# Patient Record
Sex: Male | Born: 1945 | Race: White | Hispanic: No | Marital: Married | Smoking: Former smoker
Health system: Southern US, Community
[De-identification: ages and names within clinical notes are randomized; demographics above are authoritative.]

## PROBLEM LIST (undated history)

## (undated) DIAGNOSIS — I10 Essential (primary) hypertension: Secondary | ICD-10-CM

## (undated) DIAGNOSIS — F0390 Unspecified dementia without behavioral disturbance: Secondary | ICD-10-CM

## (undated) DIAGNOSIS — G20 Parkinson's disease: Secondary | ICD-10-CM

## (undated) DIAGNOSIS — C801 Malignant (primary) neoplasm, unspecified: Secondary | ICD-10-CM

## (undated) DIAGNOSIS — E119 Type 2 diabetes mellitus without complications: Secondary | ICD-10-CM

## (undated) DIAGNOSIS — C61 Malignant neoplasm of prostate: Secondary | ICD-10-CM

## (undated) HISTORY — PX: CLOSED REDUCTION CLAVICLE FRACTURE: SUR253

---

## 2019-06-03 ENCOUNTER — Encounter (HOSPITAL_COMMUNITY): Payer: Self-pay | Admitting: *Deleted

## 2019-06-03 ENCOUNTER — Emergency Department (HOSPITAL_COMMUNITY): Payer: Medicare HMO

## 2019-06-03 ENCOUNTER — Emergency Department (HOSPITAL_COMMUNITY)
Admission: EM | Admit: 2019-06-03 | Discharge: 2019-06-03 | Disposition: A | Discharge status: Home / Self Care | Payer: Medicare HMO | Attending: Emergency Medicine | Admitting: Emergency Medicine

## 2019-06-03 ENCOUNTER — Other Ambulatory Visit: Payer: Self-pay

## 2019-06-03 DIAGNOSIS — R791 Abnormal coagulation profile: Secondary | ICD-10-CM | POA: Diagnosis not present

## 2019-06-03 DIAGNOSIS — R410 Disorientation, unspecified: Secondary | ICD-10-CM | POA: Diagnosis not present

## 2019-06-03 DIAGNOSIS — Z7982 Long term (current) use of aspirin: Secondary | ICD-10-CM | POA: Insufficient documentation

## 2019-06-03 DIAGNOSIS — Z87891 Personal history of nicotine dependence: Secondary | ICD-10-CM | POA: Insufficient documentation

## 2019-06-03 DIAGNOSIS — R479 Unspecified speech disturbances: Secondary | ICD-10-CM

## 2019-06-03 DIAGNOSIS — F0390 Unspecified dementia without behavioral disturbance: Secondary | ICD-10-CM | POA: Diagnosis not present

## 2019-06-03 DIAGNOSIS — E119 Type 2 diabetes mellitus without complications: Secondary | ICD-10-CM | POA: Diagnosis not present

## 2019-06-03 DIAGNOSIS — I10 Essential (primary) hypertension: Secondary | ICD-10-CM | POA: Diagnosis not present

## 2019-06-03 DIAGNOSIS — Z7984 Long term (current) use of oral hypoglycemic drugs: Secondary | ICD-10-CM | POA: Insufficient documentation

## 2019-06-03 DIAGNOSIS — R4182 Altered mental status, unspecified: Secondary | ICD-10-CM | POA: Diagnosis present

## 2019-06-03 DIAGNOSIS — Z79899 Other long term (current) drug therapy: Secondary | ICD-10-CM | POA: Insufficient documentation

## 2019-06-03 DIAGNOSIS — R4781 Slurred speech: Secondary | ICD-10-CM | POA: Insufficient documentation

## 2019-06-03 HISTORY — DX: Essential (primary) hypertension: I10

## 2019-06-03 HISTORY — DX: Malignant (primary) neoplasm, unspecified: C80.1

## 2019-06-03 HISTORY — DX: Type 2 diabetes mellitus without complications: E11.9

## 2019-06-03 HISTORY — DX: Malignant neoplasm of prostate: C61

## 2019-06-03 HISTORY — DX: Unspecified dementia, unspecified severity, without behavioral disturbance, psychotic disturbance, mood disturbance, and anxiety: F03.90

## 2019-06-03 LAB — CBC WITH DIFFERENTIAL/PLATELET
Abs Immature Granulocytes: 0.04 10*3/uL (ref 0.00–0.07)
Basophils Absolute: 0 10*3/uL (ref 0.0–0.1)
Basophils Relative: 0 %
Eosinophils Absolute: 0.1 10*3/uL (ref 0.0–0.5)
Eosinophils Relative: 1 %
HCT: 36.6 % — ABNORMAL LOW (ref 39.0–52.0)
Hemoglobin: 11.8 g/dL — ABNORMAL LOW (ref 13.0–17.0)
Immature Granulocytes: 1 %
Lymphocytes Relative: 10 %
Lymphs Abs: 0.8 10*3/uL (ref 0.7–4.0)
MCH: 30.5 pg (ref 26.0–34.0)
MCHC: 32.2 g/dL (ref 30.0–36.0)
MCV: 94.6 fL (ref 80.0–100.0)
Monocytes Absolute: 0.8 10*3/uL (ref 0.1–1.0)
Monocytes Relative: 10 %
Neutro Abs: 6.4 10*3/uL (ref 1.7–7.7)
Neutrophils Relative %: 78 %
Platelets: 191 10*3/uL (ref 150–400)
RBC: 3.87 MIL/uL — ABNORMAL LOW (ref 4.22–5.81)
RDW: 13.1 % (ref 11.5–15.5)
WBC: 8.1 10*3/uL (ref 4.0–10.5)
nRBC: 0 % (ref 0.0–0.2)

## 2019-06-03 LAB — COMPREHENSIVE METABOLIC PANEL
ALT: 5 U/L (ref 0–44)
AST: 15 U/L (ref 15–41)
Albumin: 3.5 g/dL (ref 3.5–5.0)
Alkaline Phosphatase: 75 U/L (ref 38–126)
Anion gap: 10 (ref 5–15)
BUN: 23 mg/dL (ref 8–23)
CO2: 26 mmol/L (ref 22–32)
Calcium: 8.8 mg/dL — ABNORMAL LOW (ref 8.9–10.3)
Chloride: 97 mmol/L — ABNORMAL LOW (ref 98–111)
Creatinine, Ser: 0.9 mg/dL (ref 0.61–1.24)
GFR calc Af Amer: >60 mL/min (ref >60)
GFR calc non Af Amer: >60 mL/min (ref >60)
Glucose, Bld: 91 mg/dL (ref 70–99)
Potassium: 4.6 mmol/L (ref 3.5–5.1)
Sodium: 133 mmol/L — ABNORMAL LOW (ref 135–145)
Total Bilirubin: 0.6 mg/dL (ref 0.3–1.2)
Total Protein: 6.3 g/dL — ABNORMAL LOW (ref 6.5–8.1)

## 2019-06-03 LAB — PROTIME-INR
INR: 1.1 (ref 0.8–1.2)
Prothrombin Time: 13.6 seconds (ref 11.4–15.2)

## 2019-06-03 LAB — APTT: aPTT: 29 seconds (ref 24–36)

## 2019-06-03 LAB — CBG MONITORING, ED: Glucose-Capillary: 86 mg/dL (ref 70–99)

## 2019-06-03 LAB — LIPASE, BLOOD: Lipase: 27 U/L (ref 11–51)

## 2019-06-03 MED ORDER — SODIUM CHLORIDE 0.9 % IV BOLUS
500.0000 mL | Freq: Once | INTRAVENOUS | Status: AC
  Administered 2019-06-03: 500 mL via INTRAVENOUS

## 2019-06-03 MED ORDER — SODIUM CHLORIDE 0.9 % IV SOLN
100.0000 mL/h | INTRAVENOUS | Status: DC
  Administered 2019-06-03: 21:00:00 100 mL/h via INTRAVENOUS

## 2019-06-03 MED ORDER — LACTATED RINGERS IV BOLUS
500.0000 mL | Freq: Once | INTRAVENOUS | Status: AC
  Administered 2019-06-03: 18:00:00 500 mL via INTRAVENOUS

## 2019-06-03 NOTE — ED Notes (Signed)
No pain

## 2019-06-03 NOTE — ED Provider Notes (Signed)
[facility]ERG[facility] DEPARTMENT Provider Note   CSN: [place]:[id number] Arrival date & time: 06/03/19  1634     History Chief Complaint  Patient presents with  . Altered Mental Status    Seth Mcpherson is a 74 y.o. male.  The history is provided by the patient and the spouse.  Altered Mental Status Presenting symptoms: confusion   Presenting symptoms comment:  Slurred speech, jumbled speech Severity:  Moderate Most recent episode:  Today Episode history:  Single Duration:  1 minute Timing:  Constant Progression:  Resolved Chronicity:  New Context: dementia   Context: not head injury, taking medications as prescribed and not recent illness   Associated symptoms: slurred speech   Associated symptoms: no abdominal pain, no difficulty breathing, no fever, no headaches, no nausea and no vomiting        Past Medical History:  Diagnosis Date  . Cancer ([place])   . Dementia ([place])   . Diabetes mellitus without complication ([place])   . Hypertension   . Prostate cancer ([place])     There are no problems to display for this patient.   History reviewed. No pertinent surgical history.     History reviewed. No pertinent family history.  Social History   Tobacco Use  . Smoking status: Former Research scientist (life sciences)  . Smokeless tobacco: Never Used  Substance Use Topics  . Alcohol use: Not Currently  . Drug use: Not on file    Home Medications Prior to Admission medications   Medication Sig Start Date End Date Taking? Authorizing Provider  aspirin EC 81 MG tablet Take 81 mg by mouth daily.   Yes [provider]  atorvastatin (LIPITOR) 40 MG tablet Take 40 mg by mouth daily.   Yes [provider]  carbidopa-levodopa (SINEMET IR) 25-100 MG tablet Take 0.5-1 tablets by mouth 4 (four) times daily.   Yes [provider]  diclofenac Sodium (VOLTAREN) 1 % GEL Apply topically 4 (four) times daily.   Yes [provider]  folic acid (FOLVITE) 1 MG  tablet Take 3 mg by mouth daily.   Yes [provider]  lisinopril (ZESTRIL) 20 MG tablet Take 20 mg by mouth daily.   Yes [provider]  metFORMIN (GLUCOPHAGE) 1000 MG tablet Take 1,000 mg by mouth 2 (two) times daily with a meal.   Yes [provider]  Methotrexate 25 MG/ML SOSY Inject 0.8 mLs into the skin once a week.   Yes [provider]  PARoxetine (PAXIL) 40 MG tablet Take 40 mg by mouth daily.   Yes [provider]  rivastigmine (EXELON) 1.5 MG capsule Take 1.5 mg by mouth 2 (two) times daily.   Yes [provider]  tamsulosin (FLOMAX) 0.4 MG CAPS capsule Take 0.4 mg by mouth.   Yes [provider]    Allergies    Patient has no known allergies.  Review of Systems   Review of Systems  Constitutional: Negative for fever.  Respiratory: Negative for cough and shortness of breath.   Cardiovascular: Negative for chest pain.  Gastrointestinal: Negative for abdominal pain, nausea and vomiting.  Neurological: Negative for headaches.  Psychiatric/Behavioral: Positive for confusion.  All other systems reviewed and are negative.   Physical Exam Updated Vital Signs BP 109/70   Pulse 65   Temp 98 F (36.7 C)   Resp 19   SpO2 100%   Physical Exam Vitals and nursing note reviewed.  Constitutional:      Appearance: He is well-developed.  HENT:     Head: Normocephalic and atraumatic.     Mouth/Throat:     Mouth: Mucous membranes are moist.     Pharynx: Oropharynx is clear.  Eyes:     Extraocular Movements: Extraocular movements intact.     Conjunctiva/sclera: Conjunctivae normal.     Pupils: Pupils are equal, round, and reactive to light.  Cardiovascular:     Rate and Rhythm: Normal rate and regular rhythm.     Pulses: Normal pulses.     Heart sounds: No murmur.  Pulmonary:     Effort: Pulmonary effort is normal. No respiratory distress.     Breath sounds: Normal breath sounds.  Abdominal:     Palpations:  Abdomen is soft.     Tenderness: There is no abdominal tenderness. There is no guarding or rebound.  Musculoskeletal:     Cervical back: Neck supple. No rigidity.  Skin:    General: Skin is warm and dry.  Neurological:     Mental Status: He is alert. Mental status is at baseline.     Cranial Nerves: Cranial nerves are intact. No dysarthria.     Sensory: Sensation is intact.     Motor: Motor function is intact. No pronator drift.     Coordination: Finger-Nose-Finger Test normal.     Deep Tendon Reflexes: Reflexes are normal and symmetric.  Psychiatric:        Mood and Affect: Mood normal.        Behavior: Behavior normal.     ED Results / Procedures / Treatments   Labs (all labs ordered are listed, but only abnormal results are displayed) Labs Reviewed  CBC WITH DIFFERENTIAL/PLATELET - Abnormal; Notable for the following components:      Result Value   RBC 3.87 (*)    Hemoglobin 11.8 (*)    HCT 36.6 (*)    All other components within normal limits  COMPREHENSIVE METABOLIC PANEL - Abnormal; Notable for the following components:   Sodium 133 (*)    Chloride 97 (*)    Calcium 8.8 (*)    Total Protein 6.3 (*)    All other components within normal limits  LIPASE, BLOOD  PROTIME-INR  APTT  URINALYSIS, ROUTINE W REFLEX MICROSCOPIC  RAPID URINE DRUG SCREEN, HOSP PERFORMED  CBG MONITORING, ED    EKG EKG Interpretation  Date/Time:  Friday June 03 2019 16:34:38 EST Ventricular Rate:  61 PR Interval:    QRS Duration: 101 QT Interval:  397 QTC Calculation: 400 R Axis:   27 Text Interpretation: Sinus rhythm Low voltage, extremity leads Baseline wander Abnormal ECG Confirmed by Carmin Muskrat [id number]) on 06/03/2019 6:49:35 PM   Radiology DG Chest 2 View  Result Date: 06/03/2019 CLINICAL DATA:  Altered mental status, weakness EXAM: CHEST - 2 VIEW COMPARISON:  None. FINDINGS: Mild atelectasis or scar at the left base. No consolidation or effusion. Normal heart size. No  pneumothorax. Possible calcified lung nodule at the right lateral lung base. IMPRESSION: No active cardiopulmonary disease. Electronically Signed   By: Donavan Foil M.D.   On: 06/03/2019 18:21   MR BRAIN WO CONTRAST  Result Date: 06/03/2019 CLINICAL DATA:  Transient ischemic attack (TIA). Additional history obtained from [place] had just finished receiving COVID vaccine with 15 minute episode of slurred speech, confusion, all cleared by the time EMS arrived EXAM: MRI HEAD WITHOUT CONTRAST TECHNIQUE: Multiplanar, multiecho pulse sequences of the brain and surrounding structures were obtained without intravenous contrast. COMPARISON:  No pertinent prior  studies available for comparison. FINDINGS: Brain: There is no evidence of acute infarct. No evidence of intracranial mass. No midline shift or extra-axial fluid collection. No chronic intracranial blood products. Moderate patchy T2/FLAIR hyperintensity within the cerebral white matter is nonspecific, but consistent with chronic small vessel ischemic disease. Chronic lacunar infarcts within the bilateral corona radiata/basal ganglia. To a lesser degree, chronic small vessel ischemic changes are present within the pons. Moderate generalized parenchymal atrophy Vascular: Flow voids maintained within the proximal large arterial vessels. Skull and upper cervical spine: No focal marrow lesion. Sinuses/Orbits: Visualized orbits demonstrate no acute abnormality. Mild ethmoid sinus mucosal thickening. Trace fluid within bilateral mastoid air cells. IMPRESSION: No evidence of acute intracranial abnormality, including acute infarction. Moderate generalized parenchymal atrophy and chronic small vessel ischemic disease. Multiple chronic lacunar infarcts in the bilateral corona radiata/basal ganglia. Electronically Signed   By: Kellie Simmering DO   On: 06/03/2019 20:33    Procedures Procedures (including critical care time)  Medications Ordered in  ED Medications  sodium chloride 0.9 % bolus 500 mL (0 mLs Intravenous Stopped 06/03/19 1857)    Followed by  0.9 %  sodium chloride infusion (100 mL/hr Intravenous New Bag/Given 06/03/19 2031)  lactated ringers bolus 500 mL (0 mLs Intravenous Stopped 06/03/19 1857)    ED Course  I have reviewed the triage vital signs and the nursing notes.  Pertinent labs & imaging results that were available during my care of the patient were reviewed by me and considered in my medical decision making (see chart for details).    MDM Rules/Calculators/A&P                       74 year old male with past medical history of Parkinson disease and prostate cancer who presents to the ED for brief episode of confusion, slurred speech.  Episode resolved after 1 minute, patient denies any lightheadedness, dizziness, headache, weakness, numbness.  Reportedly hypotensive on scene but not with EMS and normotensive here.  Differential includes TIA, orthostatic hypotension, vasovagal episode, hypoglycemia.  No hypoglycemia, normotensive, no emergent electrolyte changes or evidence of acute kidney injury.  MRI without evidence of acute stroke or other acute abnormality.  On recheck the patient is resting comfortably, at his baseline, in no distress, discussed the results with the patient and his wife, they will call his neurologist this week for follow-up.  Strict return precautions provided, discharged home in stable condition.  Final Clinical Impression(s) / ED Diagnoses Final diagnoses:  Difficulty with speech    Rx / DC Orders ED Discharge Orders    None       Azelie Noguera, [place], MD 06/03/19 2226    Carmin Muskrat, MD 06/06/19 1505

## 2019-06-03 NOTE — ED Triage Notes (Signed)
The pt just fimished getting his covid shot and he ha approx 15 minute episode of slurred speech confusion  All cleared when ems arrived, at present alert and oriented skin cool and dry  He does not know what hospital he is in  But he knows it s in [place]    He moves all extremities

## 2019-06-03 NOTE — ED Notes (Signed)
To mri 

## 2019-06-03 NOTE — ED Notes (Signed)
The pt passes his swallow screen

## 2019-08-04 ENCOUNTER — Other Ambulatory Visit: Payer: Self-pay

## 2019-08-04 ENCOUNTER — Inpatient Hospital Stay
Admission: EM | Admit: 2019-08-04 | Discharge: 2019-08-15 | DRG: 536 | Disposition: A | Discharge status: Skilled Nursing Facility | Payer: Medicare HMO | Attending: Internal Medicine | Admitting: Internal Medicine

## 2019-08-04 ENCOUNTER — Encounter: Payer: Self-pay | Admitting: *Deleted

## 2019-08-04 ENCOUNTER — Emergency Department: Payer: Medicare HMO

## 2019-08-04 DIAGNOSIS — I10 Essential (primary) hypertension: Secondary | ICD-10-CM

## 2019-08-04 DIAGNOSIS — I951 Orthostatic hypotension: Secondary | ICD-10-CM | POA: Diagnosis present

## 2019-08-04 DIAGNOSIS — Z7982 Long term (current) use of aspirin: Secondary | ICD-10-CM

## 2019-08-04 DIAGNOSIS — E860 Dehydration: Secondary | ICD-10-CM | POA: Diagnosis present

## 2019-08-04 DIAGNOSIS — G20A1 Parkinson's disease without dyskinesia, without mention of fluctuations: Secondary | ICD-10-CM

## 2019-08-04 DIAGNOSIS — S32401A Unspecified fracture of right acetabulum, initial encounter for closed fracture: Principal | ICD-10-CM | POA: Diagnosis present

## 2019-08-04 DIAGNOSIS — Z751 Person awaiting admission to adequate facility elsewhere: Secondary | ICD-10-CM

## 2019-08-04 DIAGNOSIS — R451 Restlessness and agitation: Secondary | ICD-10-CM | POA: Diagnosis not present

## 2019-08-04 DIAGNOSIS — L409 Psoriasis, unspecified: Secondary | ICD-10-CM

## 2019-08-04 DIAGNOSIS — Z8546 Personal history of malignant neoplasm of prostate: Secondary | ICD-10-CM

## 2019-08-04 DIAGNOSIS — Y92013 Bedroom of single-family (private) house as the place of occurrence of the external cause: Secondary | ICD-10-CM

## 2019-08-04 DIAGNOSIS — N179 Acute kidney failure, unspecified: Secondary | ICD-10-CM | POA: Diagnosis present

## 2019-08-04 DIAGNOSIS — R55 Syncope and collapse: Secondary | ICD-10-CM | POA: Diagnosis present

## 2019-08-04 DIAGNOSIS — R2689 Other abnormalities of gait and mobility: Secondary | ICD-10-CM | POA: Diagnosis present

## 2019-08-04 DIAGNOSIS — L259 Unspecified contact dermatitis, unspecified cause: Secondary | ICD-10-CM | POA: Diagnosis not present

## 2019-08-04 DIAGNOSIS — S32591A Other specified fracture of right pubis, initial encounter for closed fracture: Secondary | ICD-10-CM | POA: Diagnosis present

## 2019-08-04 DIAGNOSIS — Z5309 Procedure and treatment not carried out because of other contraindication: Secondary | ICD-10-CM | POA: Diagnosis present

## 2019-08-04 DIAGNOSIS — K529 Noninfective gastroenteritis and colitis, unspecified: Secondary | ICD-10-CM

## 2019-08-04 DIAGNOSIS — K5900 Constipation, unspecified: Secondary | ICD-10-CM | POA: Diagnosis not present

## 2019-08-04 DIAGNOSIS — Z20822 Contact with and (suspected) exposure to covid-19: Secondary | ICD-10-CM | POA: Diagnosis present

## 2019-08-04 DIAGNOSIS — S32409A Unspecified fracture of unspecified acetabulum, initial encounter for closed fracture: Secondary | ICD-10-CM | POA: Diagnosis present

## 2019-08-04 DIAGNOSIS — Z7984 Long term (current) use of oral hypoglycemic drugs: Secondary | ICD-10-CM

## 2019-08-04 DIAGNOSIS — K580 Irritable bowel syndrome with diarrhea: Secondary | ICD-10-CM | POA: Diagnosis present

## 2019-08-04 DIAGNOSIS — M25551 Pain in right hip: Secondary | ICD-10-CM | POA: Diagnosis present

## 2019-08-04 DIAGNOSIS — Z79899 Other long term (current) drug therapy: Secondary | ICD-10-CM

## 2019-08-04 DIAGNOSIS — G20 Parkinson's disease: Secondary | ICD-10-CM | POA: Diagnosis present

## 2019-08-04 DIAGNOSIS — E119 Type 2 diabetes mellitus without complications: Secondary | ICD-10-CM | POA: Diagnosis present

## 2019-08-04 DIAGNOSIS — W19XXXA Unspecified fall, initial encounter: Secondary | ICD-10-CM | POA: Diagnosis present

## 2019-08-04 DIAGNOSIS — I959 Hypotension, unspecified: Secondary | ICD-10-CM

## 2019-08-04 DIAGNOSIS — K5939 Other megacolon: Secondary | ICD-10-CM | POA: Diagnosis present

## 2019-08-04 DIAGNOSIS — F0280 Dementia in other diseases classified elsewhere without behavioral disturbance: Secondary | ICD-10-CM | POA: Diagnosis present

## 2019-08-04 DIAGNOSIS — Z87891 Personal history of nicotine dependence: Secondary | ICD-10-CM

## 2019-08-04 HISTORY — DX: Parkinson's disease: G20

## 2019-08-04 LAB — COMPREHENSIVE METABOLIC PANEL
ALT: 19 U/L (ref 0–44)
AST: 24 U/L (ref 15–41)
Albumin: 4.3 g/dL (ref 3.5–5.0)
Alkaline Phosphatase: 80 U/L (ref 38–126)
Anion gap: 11 (ref 5–15)
BUN: 34 mg/dL — ABNORMAL HIGH (ref 8–23)
CO2: 26 mmol/L (ref 22–32)
Calcium: 9.1 mg/dL (ref 8.9–10.3)
Chloride: 101 mmol/L (ref 98–111)
Creatinine, Ser: 1.29 mg/dL — ABNORMAL HIGH (ref 0.61–1.24)
GFR calc Af Amer: >60 mL/min (ref >60)
GFR calc non Af Amer: 54 mL/min — ABNORMAL LOW (ref >60)
Glucose, Bld: 134 mg/dL — ABNORMAL HIGH (ref 70–99)
Potassium: 4.6 mmol/L (ref 3.5–5.1)
Sodium: 138 mmol/L (ref 135–145)
Total Bilirubin: 0.8 mg/dL (ref 0.3–1.2)
Total Protein: 7.1 g/dL (ref 6.5–8.1)

## 2019-08-04 LAB — CBC WITH DIFFERENTIAL/PLATELET
Abs Immature Granulocytes: 0.04 10*3/uL (ref 0.00–0.07)
Basophils Absolute: 0 10*3/uL (ref 0.0–0.1)
Basophils Relative: 1 %
Eosinophils Absolute: 0.2 10*3/uL (ref 0.0–0.5)
Eosinophils Relative: 3 %
HCT: 38.2 % — ABNORMAL LOW (ref 39.0–52.0)
Hemoglobin: 12.4 g/dL — ABNORMAL LOW (ref 13.0–17.0)
Immature Granulocytes: 1 %
Lymphocytes Relative: 18 %
Lymphs Abs: 1 10*3/uL (ref 0.7–4.0)
MCH: 30 pg (ref 26.0–34.0)
MCHC: 32.5 g/dL (ref 30.0–36.0)
MCV: 92.5 fL (ref 80.0–100.0)
Monocytes Absolute: 0.6 10*3/uL (ref 0.1–1.0)
Monocytes Relative: 11 %
Neutro Abs: 3.5 10*3/uL (ref 1.7–7.7)
Neutrophils Relative %: 66 %
Platelets: 166 10*3/uL (ref 150–400)
RBC: 4.13 MIL/uL — ABNORMAL LOW (ref 4.22–5.81)
RDW: 13.4 % (ref 11.5–15.5)
WBC: 5.3 10*3/uL (ref 4.0–10.5)
nRBC: 0 % (ref 0.0–0.2)

## 2019-08-04 LAB — TROPONIN I (HIGH SENSITIVITY): Troponin I (High Sensitivity): 3 ng/L (ref <18)

## 2019-08-04 MED ORDER — MORPHINE SULFATE (PF) 4 MG/ML IV SOLN
4.0000 mg | Freq: Once | INTRAVENOUS | Status: AC
  Administered 2019-08-04: 4 mg via INTRAVENOUS
  Filled 2019-08-04: qty 1

## 2019-08-04 MED ORDER — MIDAZOLAM HCL 2 MG/2ML IJ SOLN
2.0000 mg | Freq: Once | INTRAMUSCULAR | Status: AC
  Administered 2019-08-04: 2 mg via INTRAVENOUS
  Filled 2019-08-04: qty 2

## 2019-08-04 MED ORDER — LACTATED RINGERS IV BOLUS
1000.0000 mL | Freq: Once | INTRAVENOUS | Status: AC
  Administered 2019-08-04: 1000 mL via INTRAVENOUS

## 2019-08-04 NOTE — ED Provider Notes (Signed)
[facility] Emergency Department Provider Note   ____________________________________________   First MD Initiated Contact with Patient 08/04/19 2152     (approximate)  I have reviewed the triage vital signs and the nursing notes.   HISTORY  Chief Complaint Fall and Dizziness    HPI Seth Mcpherson is a 74 y.o. male with past medical history of dementia, hypertension, diabetes, and prostate cancer presents to the ED complaining of fall and hip pain.  Patient reports that he has been dealing with diarrhea over the past couple of days and when he went to stand up out of bed this evening, suddenly felt lightheaded and lost consciousness.  He fell to the ground, striking his head and his right hip.  He had immediate onset of right hip pain and has been unable to ambulate since then.  He denies any chest pain, shortness of breath, headache, neck pain, or other extremity pain.  He has not had any prior injuries to his right hip.        Past Medical History:  Diagnosis Date  . Cancer ([place])   . Dementia ([place])   . Diabetes mellitus without complication ([place])   . Hypertension   . Prostate cancer ([place])     There are no problems to display for this patient.   History reviewed. No pertinent surgical history.  Prior to Admission medications   Medication Sig Start Date End Date Taking? Authorizing Provider  aspirin EC 81 MG tablet Take 81 mg by mouth daily.    [provider]  atorvastatin (LIPITOR) 40 MG tablet Take 40 mg by mouth daily.    [provider]  carbidopa-levodopa (SINEMET IR) 25-100 MG tablet Take 0.5-1 tablets by mouth 4 (four) times daily.    [provider]  diclofenac Sodium (VOLTAREN) 1 % GEL Apply topically 4 (four) times daily.    [provider]  folic acid (FOLVITE) 1 MG tablet Take 3 mg by mouth daily.    [provider]  lisinopril (ZESTRIL) 20 MG tablet Take 20 mg by mouth daily.    [provider]  metFORMIN (GLUCOPHAGE) 1000 MG tablet Take 1,000 mg by mouth 2 (two) times daily with a meal.    [provider]  Methotrexate 25 MG/ML SOSY Inject 0.8 mLs into the skin once a week.    [provider]  PARoxetine (PAXIL) 40 MG tablet Take 40 mg by mouth daily.    [provider]  rivastigmine (EXELON) 1.5 MG capsule Take 1.5 mg by mouth 2 (two) times daily.    [provider]  tamsulosin (FLOMAX) 0.4 MG CAPS capsule Take 0.4 mg by mouth.    [provider]    Allergies Patient has no known allergies.  History reviewed. No pertinent family history.  Social History Social History   Tobacco Use  . Smoking status: Former Research scientist (life sciences)  . Smokeless tobacco: Never Used  Substance Use Topics  . Alcohol use: Not Currently  . Drug use: Not on file    Review of Systems  Constitutional: No fever/chills. Eyes: No visual changes. ENT: No sore throat. Cardiovascular: Denies chest pain.  Positive for syncope. Respiratory: Denies shortness of breath. Gastrointestinal: No abdominal pain.  No nausea, no vomiting.  Positive for diarrhea.  No constipation. Genitourinary: Negative for dysuria. Musculoskeletal: Negative for back pain.  Positive for right hip pain. Skin: Negative for rash. Neurological: Negative for headaches, focal weakness or numbness.  ____________________________________________   PHYSICAL EXAM:  VITAL SIGNS: ED Triage Vitals [08/04/19 2149]  Enc Vitals Group     BP 100/65     Pulse Rate 88     Resp 18     Temp      Temp src      SpO2 100 %     Weight      Height      Head Circumference      Peak Flow      Pain Score      Pain Loc      Pain Edu?      Excl. in [place]?     Constitutional: Alert and oriented. Eyes: Conjunctivae are normal. Head: Atraumatic. Nose: No congestion/rhinnorhea. Mouth/Throat: Mucous membranes are moist. Neck: Normal ROM, no midline cervical spine tenderness. Cardiovascular:  Normal rate, regular rhythm. Grossly normal heart sounds. Respiratory: Normal respiratory effort.  No retractions. Lungs CTAB. Gastrointestinal: Soft and nontender. No distention. Genitourinary: deferred Musculoskeletal: Diffuse tenderness to right hip with obvious deformity.  2+ DP pulses bilaterally. Neurologic:  Normal speech and language. No gross focal neurologic deficits are appreciated. Skin:  Skin is warm, dry and intact. No rash noted. Psychiatric: Mood and affect are normal. Speech and behavior are normal.  ____________________________________________   LABS (all labs ordered are listed, but only abnormal results are displayed)  Labs Reviewed  CBC WITH DIFFERENTIAL/PLATELET - Abnormal; Notable for the following components:      Result Value   RBC 4.13 (*)    Hemoglobin 12.4 (*)    HCT 38.2 (*)    All other components within normal limits  COMPREHENSIVE METABOLIC PANEL - Abnormal; Notable for the following components:   Glucose, Bld 134 (*)    BUN 34 (*)    Creatinine, Ser 1.29 (*)    GFR calc non Af Amer 54 (*)    All other components within normal limits  RESPIRATORY PANEL BY RT PCR (FLU A&B, COVID)  TROPONIN I (HIGH SENSITIVITY)  TROPONIN I (HIGH SENSITIVITY)   ____________________________________________  EKG  ED ECG REPORT I, Blake Divine, the attending physician, personally viewed and interpreted this ECG.   Date: 08/04/2019  EKG Time: 21:59  Rate: 79  Rhythm: normal sinus rhythm  Axis: Normal  Intervals:none  ST&T Change: None   PROCEDURES  Procedure(s) performed (including Critical Care):  Procedures   ____________________________________________   INITIAL IMPRESSION / ASSESSMENT AND PLAN / ED COURSE       74 year old male presents to the ED after becoming lightheaded and having syncopal episode at home, falling onto his right hip and head.  He has no focal neurologic deficits and is neurovascularly intact to his bilateral lower  extremities.  CT head is negative for acute process, however CT imaging of his right hip shows comminuted acetabular fracture with extension into his iliac crest and inferior pubic rami.  Case was discussed with Dr. Roland Rack of orthopedics, who states that we do not have the capacity here at [facility] to address this type of injury and patient would be best served by transfer to a trauma center.  Case discussed with [facility], and [facility], all of whom state that they have no beds currently available.  Plan to discuss with [facility] for potential transfer, although if no beds are available he may require admission here to [facility] while he is placed on wait list for transfer.  Syncope work-up thus far has been unremarkable, EKG without evidence of arrhythmia or ischemia.  I suspect vasovagal syncope versus  dehydration, patient hydrated with IV fluids.      ____________________________________________   FINAL CLINICAL IMPRESSION(S) / ED DIAGNOSES  Final diagnoses:  Closed displaced fracture of right acetabulum, unspecified portion of acetabulum, initial encounter ([place])  Syncope, unspecified syncope type     ED Discharge Orders    None       Note:  This document was prepared using Dragon voice recognition software and may include unintentional dictation errors.   Blake Divine, MD 08/05/19 [phone]

## 2019-08-04 NOTE — ED Notes (Signed)
PAM, CT to [facility] to [facility]

## 2019-08-04 NOTE — ED Triage Notes (Signed)
Pt to ED from home after a fall with right hip pain. Pt reports he was dizzy and reports having blacked out prior to the fall but has not since the fall. Pt arrived to ED laying on his stomach and reports inability to roll onto his back due to pain.   Per pts wife pt was pale upon their arrival and hypotensive at 72/48.  CBG 150

## 2019-08-05 ENCOUNTER — Inpatient Hospital Stay (HOSPITAL_COMMUNITY)
Admission: AD | Admit: 2019-08-05 | Payer: Medicare HMO | Source: Other Acute Inpatient Hospital | Admitting: Family Medicine

## 2019-08-05 ENCOUNTER — Other Ambulatory Visit: Payer: Self-pay

## 2019-08-05 DIAGNOSIS — S32591A Other specified fracture of right pubis, initial encounter for closed fracture: Secondary | ICD-10-CM | POA: Diagnosis present

## 2019-08-05 DIAGNOSIS — Z751 Person awaiting admission to adequate facility elsewhere: Secondary | ICD-10-CM | POA: Diagnosis not present

## 2019-08-05 DIAGNOSIS — I10 Essential (primary) hypertension: Secondary | ICD-10-CM

## 2019-08-05 DIAGNOSIS — Z7982 Long term (current) use of aspirin: Secondary | ICD-10-CM | POA: Diagnosis not present

## 2019-08-05 DIAGNOSIS — Z79899 Other long term (current) drug therapy: Secondary | ICD-10-CM | POA: Diagnosis not present

## 2019-08-05 DIAGNOSIS — S32401A Unspecified fracture of right acetabulum, initial encounter for closed fracture: Secondary | ICD-10-CM | POA: Diagnosis present

## 2019-08-05 DIAGNOSIS — R55 Syncope and collapse: Secondary | ICD-10-CM | POA: Diagnosis present

## 2019-08-05 DIAGNOSIS — K5900 Constipation, unspecified: Secondary | ICD-10-CM | POA: Diagnosis not present

## 2019-08-05 DIAGNOSIS — Z20822 Contact with and (suspected) exposure to covid-19: Secondary | ICD-10-CM | POA: Diagnosis present

## 2019-08-05 DIAGNOSIS — I959 Hypotension, unspecified: Secondary | ICD-10-CM

## 2019-08-05 DIAGNOSIS — R451 Restlessness and agitation: Secondary | ICD-10-CM | POA: Diagnosis not present

## 2019-08-05 DIAGNOSIS — Z87891 Personal history of nicotine dependence: Secondary | ICD-10-CM | POA: Diagnosis not present

## 2019-08-05 DIAGNOSIS — E119 Type 2 diabetes mellitus without complications: Secondary | ICD-10-CM

## 2019-08-05 DIAGNOSIS — K5939 Other megacolon: Secondary | ICD-10-CM | POA: Diagnosis present

## 2019-08-05 DIAGNOSIS — W19XXXA Unspecified fall, initial encounter: Secondary | ICD-10-CM | POA: Diagnosis present

## 2019-08-05 DIAGNOSIS — G20 Parkinson's disease: Secondary | ICD-10-CM

## 2019-08-05 DIAGNOSIS — R2689 Other abnormalities of gait and mobility: Secondary | ICD-10-CM | POA: Diagnosis present

## 2019-08-05 DIAGNOSIS — Y92013 Bedroom of single-family (private) house as the place of occurrence of the external cause: Secondary | ICD-10-CM | POA: Diagnosis not present

## 2019-08-05 DIAGNOSIS — Z5309 Procedure and treatment not carried out because of other contraindication: Secondary | ICD-10-CM | POA: Diagnosis present

## 2019-08-05 DIAGNOSIS — K529 Noninfective gastroenteritis and colitis, unspecified: Secondary | ICD-10-CM

## 2019-08-05 DIAGNOSIS — S32409A Unspecified fracture of unspecified acetabulum, initial encounter for closed fracture: Secondary | ICD-10-CM | POA: Diagnosis present

## 2019-08-05 DIAGNOSIS — N179 Acute kidney failure, unspecified: Secondary | ICD-10-CM

## 2019-08-05 DIAGNOSIS — S32401D Unspecified fracture of right acetabulum, subsequent encounter for fracture with routine healing: Secondary | ICD-10-CM | POA: Diagnosis not present

## 2019-08-05 DIAGNOSIS — K580 Irritable bowel syndrome with diarrhea: Secondary | ICD-10-CM | POA: Diagnosis present

## 2019-08-05 DIAGNOSIS — E860 Dehydration: Secondary | ICD-10-CM | POA: Diagnosis present

## 2019-08-05 DIAGNOSIS — F0280 Dementia in other diseases classified elsewhere without behavioral disturbance: Secondary | ICD-10-CM | POA: Diagnosis present

## 2019-08-05 DIAGNOSIS — G20A1 Parkinson's disease without dyskinesia, without mention of fluctuations: Secondary | ICD-10-CM

## 2019-08-05 DIAGNOSIS — I951 Orthostatic hypotension: Secondary | ICD-10-CM | POA: Diagnosis present

## 2019-08-05 DIAGNOSIS — Z7984 Long term (current) use of oral hypoglycemic drugs: Secondary | ICD-10-CM | POA: Diagnosis not present

## 2019-08-05 DIAGNOSIS — L259 Unspecified contact dermatitis, unspecified cause: Secondary | ICD-10-CM | POA: Diagnosis not present

## 2019-08-05 DIAGNOSIS — M25551 Pain in right hip: Secondary | ICD-10-CM | POA: Diagnosis present

## 2019-08-05 DIAGNOSIS — Z8546 Personal history of malignant neoplasm of prostate: Secondary | ICD-10-CM | POA: Diagnosis not present

## 2019-08-05 LAB — GLUCOSE, CAPILLARY
Glucose-Capillary: 158 mg/dL — ABNORMAL HIGH (ref 70–99)
Glucose-Capillary: 133 mg/dL — ABNORMAL HIGH (ref 70–99)
Glucose-Capillary: 121 mg/dL — ABNORMAL HIGH (ref 70–99)
Glucose-Capillary: 133 mg/dL — ABNORMAL HIGH (ref 70–99)
Glucose-Capillary: 105 mg/dL — ABNORMAL HIGH (ref 70–99)

## 2019-08-05 LAB — TROPONIN I (HIGH SENSITIVITY): Troponin I (High Sensitivity): 2 ng/L (ref <18)

## 2019-08-05 LAB — RESPIRATORY PANEL BY RT PCR (FLU A&B, COVID)
Influenza A by PCR: NEGATIVE
Influenza B by PCR: NEGATIVE
SARS Coronavirus 2 by RT PCR: NEGATIVE

## 2019-08-05 LAB — HEMOGLOBIN A1C
Hgb A1c MFr Bld: 6.1 % — ABNORMAL HIGH (ref 4.8–5.6)
Mean Plasma Glucose: 128.37 mg/dL

## 2019-08-05 MED ORDER — HYDROMORPHONE HCL 1 MG/ML IJ SOLN
0.5000 mg | INTRAMUSCULAR | Status: DC | PRN
  Administered 2019-08-05 (×4): 0.5 mg via INTRAVENOUS
  Filled 2019-08-05 (×5): qty 1

## 2019-08-05 MED ORDER — SODIUM CHLORIDE 0.9 % IV BOLUS
1000.0000 mL | Freq: Once | INTRAVENOUS | Status: AC
  Administered 2019-08-05: 1000 mL via INTRAVENOUS

## 2019-08-05 MED ORDER — MORPHINE SULFATE (PF) 2 MG/ML IV SOLN: 0.5000 mg | INTRAVENOUS | Status: DC | PRN

## 2019-08-05 MED ORDER — HYDROCODONE-ACETAMINOPHEN 5-325 MG PO TABS
1.0000 | ORAL_TABLET | Freq: Four times a day (QID) | ORAL | Status: DC | PRN
  Administered 2019-08-05 – 2019-08-06 (×2): 2 via ORAL
  Administered 2019-08-06 – 2019-08-07 (×3): 1 via ORAL
  Filled 2019-08-05 (×2): qty 1
  Filled 2019-08-05 (×2): qty 2
  Filled 2019-08-05 (×2): qty 1

## 2019-08-05 MED ORDER — PAROXETINE HCL 20 MG PO TABS
40.0000 mg | ORAL_TABLET | Freq: Every day | ORAL | Status: DC
  Administered 2019-08-05 – 2019-08-15 (×11): 40 mg via ORAL
  Filled 2019-08-05 (×11): qty 2

## 2019-08-05 MED ORDER — FENTANYL CITRATE (PF) 100 MCG/2ML IJ SOLN
50.0000 ug | Freq: Once | INTRAMUSCULAR | Status: AC
  Administered 2019-08-05: 50 ug via INTRAVENOUS
  Filled 2019-08-05: qty 2

## 2019-08-05 MED ORDER — HYDROMORPHONE HCL 1 MG/ML IJ SOLN
0.5000 mg | INTRAMUSCULAR | Status: DC | PRN
  Administered 2019-08-05 – 2019-08-06 (×3): 0.5 mg via INTRAVENOUS
  Filled 2019-08-05 (×2): qty 1

## 2019-08-05 MED ORDER — ATORVASTATIN CALCIUM 20 MG PO TABS
40.0000 mg | ORAL_TABLET | Freq: Every day | ORAL | Status: DC
  Administered 2019-08-05 – 2019-08-15 (×11): 40 mg via ORAL
  Filled 2019-08-05 (×11): qty 2

## 2019-08-05 MED ORDER — TAMSULOSIN HCL 0.4 MG PO CAPS
0.4000 mg | ORAL_CAPSULE | Freq: Every day | ORAL | Status: DC
  Administered 2019-08-05 – 2019-08-15 (×11): 0.4 mg via ORAL
  Filled 2019-08-05 (×11): qty 1

## 2019-08-05 MED ORDER — FOLIC ACID 1 MG PO TABS
3.0000 mg | ORAL_TABLET | Freq: Every day | ORAL | Status: DC
  Administered 2019-08-05 – 2019-08-15 (×11): 3 mg via ORAL
  Filled 2019-08-05 (×11): qty 3

## 2019-08-05 MED ORDER — ENOXAPARIN SODIUM 40 MG/0.4ML ~~LOC~~ SOLN
40.0000 mg | SUBCUTANEOUS | Status: DC
  Administered 2019-08-05 – 2019-08-15 (×11): 40 mg via SUBCUTANEOUS
  Filled 2019-08-05 (×11): qty 0.4

## 2019-08-05 MED ORDER — ASPIRIN EC 81 MG PO TBEC
81.0000 mg | DELAYED_RELEASE_TABLET | Freq: Every day | ORAL | Status: DC
  Administered 2019-08-05 – 2019-08-15 (×11): 81 mg via ORAL
  Filled 2019-08-05 (×11): qty 1

## 2019-08-05 MED ORDER — SODIUM CHLORIDE 0.9 % IV SOLN: INTRAVENOUS | Status: DC

## 2019-08-05 MED ORDER — CARBIDOPA-LEVODOPA 25-100 MG PO TABS
0.5000 | ORAL_TABLET | Freq: Four times a day (QID) | ORAL | Status: DC
  Administered 2019-08-05 – 2019-08-08 (×15): 1 via ORAL
  Filled 2019-08-05 (×19): qty 1

## 2019-08-05 MED ORDER — RIVASTIGMINE TARTRATE 1.5 MG PO CAPS
1.5000 mg | ORAL_CAPSULE | Freq: Two times a day (BID) | ORAL | Status: DC
  Administered 2019-08-05 – 2019-08-15 (×21): 1.5 mg via ORAL
  Filled 2019-08-05 (×24): qty 1

## 2019-08-05 MED ORDER — INSULIN ASPART 100 UNIT/ML ~~LOC~~ SOLN
0.0000 [IU] | SUBCUTANEOUS | Status: DC
  Administered 2019-08-05 – 2019-08-10 (×14): 1 [IU] via SUBCUTANEOUS
  Administered 2019-08-10: 2 [IU] via SUBCUTANEOUS
  Administered 2019-08-11 – 2019-08-13 (×7): 1 [IU] via SUBCUTANEOUS
  Administered 2019-08-14: 2 [IU] via SUBCUTANEOUS
  Administered 2019-08-15: 1 [IU] via SUBCUTANEOUS
  Filled 2019-08-05 (×24): qty 1

## 2019-08-05 NOTE — ED Provider Notes (Signed)
-----------------------------------------   12:52 AM on 08/05/2019 -----------------------------------------  [facility] has no bed availability. Called [facility] to place patient on their waitlist as [facility] is his preferred facility for transfer. Will admit to hospitalist services. Spoke with Dr. Roland Rack for orthopedic-specific orders; recommends nonweightbearing.   Paulette Blanch, MD 08/05/19 [phone]

## 2019-08-05 NOTE — Progress Notes (Signed)
Seth Mcpherson wife called knows the password and requested an update about pt.  Update given to wife. Will continue to monitor.

## 2019-08-05 NOTE — Plan of Care (Signed)
  Problem: Pain Managment: Goal: General experience of comfort will improve 08/05/2019 0618 by Nickola Major, RN Outcome: Progressing 08/05/2019 0515 by Nickola Major, RN Outcome: Progressing   Problem: Safety: Goal: Ability to remain free from injury will improve Outcome: Progressing

## 2019-08-05 NOTE — Discharge Summary (Signed)
Physician Discharge Summary  Seth Mcpherson [id number] DOB: [birth year] DOA: 08/04/2019  PCP: System, Pcp Not In  Admit date: 08/04/2019 Discharge date: 08/05/2019  Plan transfer to [facility] when bed available Accepting physician Dr. Fuller Plan Reason for transfer to complex orthopedic care   Discharge Diagnoses: Principal diagnosis is #1 1. Closed right acetabular fracture, complex 2. Syncope secondary to autonomic instability, possible orthostatic hypotension with mild dehydration, probably related to diarrhea 3. Dehydration 4. Diabetes mellitus type 2 5. Parkinson's disease 6. 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  Found to have complex acetabular fracture, orthopedics at [facility] felt patient needed higher level of care.  No beds available at [facility] or [facility] at that time, placed on [facility] waitlist.  Patient admitted for pain control until transfer to [facility] accomplished.  Hospital Course:  Hospitalization uncomplicated, continues with pain control which has been adequate.  [facility] unable to accept in transfer, case discussed with orthopedics as detailed below, will be seen by trauma orthopedics at [facility]  Closed right acetabular fracture, complex --Dr. Roland Rack [facility] orthopedics consulted for assistance until patient can be transferred --Continue pain control which has been adequate --Transfer to [facility], bed requested, discussed with orthopedics at [facility]. Accepting Dr. Fuller Plan. Dr. Marcelino Scot will see in consult Hilbert Odor PA will assist)  Syncope secondary to autonomic  instability, possible orthostatic hypotension with mild dehydration, probably related to diarrhea --Telemetry unremarkable, high-sensitivity troponin negative, no EKG performed. --No further evaluation suggested  Dehydration --Continue IV fluids, recheck BMP in a.m.  Diabetes mellitus type 2 --Sliding scale insulin  Parkinson's disease --Continue Sinemet, Exelon  Irritable bowel syndrome with chronic diarrhea --Continue supportive care  Significant Hospital Events   . 4/29 admitted for complex right acetabular fracture.  Transfer to [facility] planned. . 4/30 [facility] unilaterally canceled transfer, not accepting waitlist for orthopedics cases. . 4/30 I discussed with [facility] orthopedics and transfer center, they continue to have no beds and cannot offer waitlist . 4/30 discussed with Dr. Marcelino Scot trauma orthopedics, agrees to transfer to [facility] and will see in consultation   Consults:  . Orthopedics    Procedures:  . None   Significant Diagnostic Tests:  . Noted below   Micro Data:  .    Antimicrobials:  .   Today's assessment: S: Has some hip pain but pain medication has been effective.  No nausea or vomiting.  No chest pain or shortness of breath. O: Vitals:  Vitals:   08/05/19 0750 08/05/19 1147  BP: 110/67 110/66  Pulse: 75 72  Resp: 17 18  Temp: 98 F (36.7 C) 98.3 F (36.8 C)  SpO2: 97% 95%    Constitutional:  . Appears calm and comfortable Respiratory:  . CTA bilaterally, no w/r/r.  . Respiratory effort normal.  Cardiovascular:  . RRR, no m/r/g . No LE extremity edema   . Dorsalis pedis pulses 2+ bilaterally Musculoskeletal:  . RLE, LLE   . Moves both extremities to command Skin:  . Right lower extremity skin appears unremarkable.  Right foot warm Neurologic:  . Sensation grossly intact right foot Psychiatric:  . judgement and insight appear normal .  Mental status o Mood, affect appropriate  CBG stable, BUN slightly elevated at 34, creatinine slightly  elevated 1.29.  High-sensitivity troponin is negative.  CBC unremarkable CT head negative CT head noted   Discharge Instructions    No Known Allergies  The results of significant diagnostics from this hospitalization (including imaging, microbiology, ancillary and laboratory) are listed below for reference.    Significant Diagnostic Studies: CT Head Wo Contrast  Result Date: 08/04/2019 CLINICAL DATA:  Fall EXAM: CT HEAD WITHOUT CONTRAST TECHNIQUE: Contiguous axial images were obtained from the base of the skull through the vertex without intravenous contrast. COMPARISON:  None. FINDINGS: Brain: There is no mass, hemorrhage or extra-axial collection. There is generalized atrophy without lobar predilection. Hypodensity of the white matter is most commonly associated with chronic microvascular disease. Vascular: No abnormal hyperdensity of the major intracranial arteries or dural venous sinuses. No intracranial atherosclerosis. Skull: The visualized skull base, calvarium and extracranial soft tissues are normal. Sinuses/Orbits: No fluid levels or advanced mucosal thickening of the visualized paranasal sinuses. No mastoid or middle ear effusion. The orbits are normal. IMPRESSION: Generalized atrophy and chronic microvascular ischemia without acute intracranial abnormality. Electronically Signed   By: Ulyses Jarred M.D.   On: 08/04/2019 22:30   CT Hip Right Wo Contrast  Result Date: 08/04/2019 CLINICAL DATA:  Post fall with right hip pain. Hip trauma, fracture suspected, no prior imaging EXAM: CT OF THE RIGHT HIP WITHOUT CONTRAST TECHNIQUE: Multidetector CT imaging of the right hip was performed according to the standard protocol. Multiplanar CT image reconstructions were also generated. COMPARISON:  None. FINDINGS: Bones/Joint/Cartilage Highly comminuted complex acetabular fracture with multifocal articular involvement. There is articular distraction of 8 mm as well as step of up to 4 mm. There is  slight protrusion of dominant fracture fragments. Fracture extends to involve the anterior iliac crest. Fracture involves the puboacetabular junction. There is a comminuted and displaced right inferior pubic ramus fracture. The sacroiliac joint is congruent were visualized. No definite right sacral fracture. Ligaments Suboptimally assessed by CT. Muscles and Tendons Intramuscular hemorrhage involving the iliopsoas and obturator muscles related to acetabular fracture. No evidence of gluteal hematoma. Soft tissues Mild soft tissue edema. Enlarged prostate gland is partially included. IMPRESSION: 1. Highly comminuted and displaced complex right acetabular fracture with multifocal articular involvement. 2. Fracture also involves the puboacetabular junction as well as extends superiorly to involve the anterior iliac crest. 3. Comminuted and displaced right inferior pubic ramus fracture. 4. Intramuscular hemorrhage involving the iliopsoas and obturator muscles related to acetabular fracture. Electronically Signed   By: Keith Rake M.D.   On: 08/04/2019 22:34    Microbiology: Recent Results (from the past 240 hour(s))  Respiratory Panel by RT PCR (Flu A&B, Covid) - Nasopharyngeal Swab     Status: None   Collection Time: 08/04/19 10:53 PM   Specimen: Nasopharyngeal Swab  Result Value Ref Range Status   SARS Coronavirus 2 by RT PCR NEGATIVE NEGATIVE Final    Comment: (NOTE) SARS-CoV-2 target nucleic acids are NOT DETECTED. The SARS-CoV-2 RNA is generally detectable in upper respiratoy specimens during the acute phase of infection. The lowest concentration of SARS-CoV-2 viral copies this assay can detect is 131 copies/mL. A negative result does not preclude SARS-Cov-2 infection and should not be used as the sole basis for treatment or other patient management decisions. A negative result may occur with  improper specimen collection/handling, submission of specimen other than nasopharyngeal swab,  presence of viral mutation(s) within the areas targeted  by this assay, and inadequate number of viral copies (<131 copies/mL). A negative result must be combined with clinical observations, patient history, and epidemiological information. The expected result is Negative. Fact Sheet for Patients:  PinkCheek.be Fact Sheet for Healthcare Providers:  GravelBags.it This test is not yet ap proved or cleared by the [place] FDA and  has been authorized for detection and/or diagnosis of SARS-CoV-2 by FDA under an Emergency Use Authorization (EUA). This EUA will remain  in effect (meaning this test can be used) for the duration of the COVID-19 declaration under Section 564(b)(1) of the Act, 21 U.S.C. section 360bbb-3(b)(1), unless the authorization is terminated or revoked sooner.    Influenza A by PCR NEGATIVE NEGATIVE Final   Influenza B by PCR NEGATIVE NEGATIVE Final    Comment: (NOTE) The Xpert Xpress SARS-CoV-2/FLU/RSV assay is intended as an aid in  the diagnosis of influenza from Nasopharyngeal swab specimens and  should not be used as a sole basis for treatment. Nasal washings and  aspirates are unacceptable for Xpert Xpress SARS-CoV-2/FLU/RSV  testing. Fact Sheet for Patients: PinkCheek.be Fact Sheet for Healthcare Providers: GravelBags.it This test is not yet approved or cleared by the [place] FDA and  has been authorized for detection and/or diagnosis of SARS-CoV-2 by  FDA under an Emergency Use Authorization (EUA). This EUA will remain  in effect (meaning this test can be used) for the duration of the  Covid-19 declaration under Section 564(b)(1) of the Act, 21  U.S.C. section 360bbb-3(b)(1), unless the authorization is  terminated or revoked. Performed at [facility], [address] [place], [place] [address]      Labs: Basic Metabolic  Panel: Recent Labs  Lab 08/04/19 2156  NA 138  K 4.6  CL 101  CO2 26  GLUCOSE 134*  BUN 34*  CREATININE 1.29*  CALCIUM 9.1   Liver Function Tests: Recent Labs  Lab 08/04/19 2156  AST 24  ALT 19  ALKPHOS 80  BILITOT 0.8  PROT 7.1  ALBUMIN 4.3   CBC: Recent Labs  Lab 08/04/19 2156  WBC 5.3  NEUTROABS 3.5  HGB 12.4*  HCT 38.2*  MCV 92.5  PLT 166   CBG: Recent Labs  Lab 08/05/19 0411 08/05/19 0751 08/05/19 1145  GLUCAP 158* 133* 121*    Principal Problem:   Closed right acetabular fracture (HCC) Active Problems:   Chronic diarrhea   Type 2 diabetes mellitus without complication (HCC)   Essential hypertension   Parkinson disease (HCC)   Cognitive deficit due to Parkinson's disease ([place])   Syncope   Hypotension   AKI (acute kidney injury) ([place])   Acetabular fracture ([place])   Time coordinating discharge: 55 minutes  Signed:  Murray Hodgkins, MD  [facility]  08/05/2019, 12:55 PM

## 2019-08-05 NOTE — Progress Notes (Signed)
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  Found to have complex acetabular fracture, orthopedics at [facility] felt patient needed higher level of care.  No beds available at [place] or [facility] at that time, placed on [facility] waitlist.  Patient admitted for pain control until transfer to [facility] accomplished.  A & P  Closed right acetabular fracture, complex --Dr. Roland Rack [facility] orthopedics consulted for assistance until patient can be transferred --Continue pain control which has been adequate --[facility] unilaterally canceled transfer request, not offering a wait list for this diagnosis. --I called [facility] today and discussed with orthopedics, still no beds --I I initiated contact with orthopedics at [facility] with patient's permission, reviewed in detail with Dr. Marcelino Scot orthopedics trauma specialist.  Reviewed films and discussed case.  Initially recommended transfer for consideration of surgery.  Upon subsequent review, it was unclear whether patient would benefit from surgery.  Dr. Marcelino Scot was kind enough to discuss the case in depth with the patient, daughter and wife by telephone with me present.  He reviewed in detail the treatment options including conservative management versus surgical intervention.  Ultimately the family and patient decided on conservative management.  Per Dr. Marcelino Scot can immediately begin touch toe weight bearing, pursue physical therapy, pain control and rehab.  Follow-up x-rays in 5 days and outpatient management per orthopedics. --Follow-up consultation from Dr. Roland Rack recommended nonweightbearing status this patient not apparently able to maintain toe-touch weightbearing status.  Syncope secondary to autonomic instability, possible orthostatic  hypotension with mild dehydration, probably related to diarrhea --Telemetry unremarkable, high-sensitivity troponin negative, no EKG performed. --No further evaluation suggested  Dehydration --Continue IV fluids, recheck BMP in a.m.  Diabetes mellitus type 2 --Sliding scale insulin  Parkinson's disease --Continue Sinemet, Exelon  Irritable bowel syndrome with chronic diarrhea --Continue supportive care  Disposition Plan:   Status is: Inpatient  Remains inpatient appropriate because:Ongoing active pain requiring inpatient pain management   Dispo: The patient is from: Home              Anticipated d/c is to: To be determined              Anticipated d/c date is: 2 days              Patient currently is not medically stable to d/c.  DVT prophylaxis: enoxaparin Code Status: Full Family Communication: wife, daughter at bedside  In addition to the medical care of this patient, I spent an additional 50 minutes in counseling and coordination of care, discussion with the patient, daughter, wife, multiple visits to the room, multiple conversations with Dr. Marcelino Scot, approximately [phone]  Murray Hodgkins, MD  [facility] Direct contact: see [email or web address] (further directions at bottom of note if needed) 7PM-7AM contact night coverage as at bottom of note 08/05/2019, 4:54 PM  LOS: 0 days   Significant Hospital Events   .    Consults:  .    Procedures:  .   Significant Diagnostic Tests:  Marland Kitchen    Micro Data:  .    Antimicrobials:  .   Interval History/Subjective  Has some hip pain but pain medication has been effective.  No nausea or vomiting.  No chest pain or shortness of breath.  Objective   Vitals:  Vitals:   08/05/19 1147  08/05/19 1610  BP: 110/66 108/65  Pulse: 72 81  Resp: 18 17  Temp: 98.3 F (36.8 C) (!) 97.5 F (36.4 C)  SpO2: 95% 95%    Exam:  Constitutional:   Appears calm and comfortable Respiratory:   CTA bilaterally, no w/r/r.    Respiratory effort normal.  Cardiovascular:   RRR, no m/r/g  No LE extremity edema    Dorsalis pedis pulses 2+ bilaterally Musculoskeletal:   RLE, LLE    Moves both extremities to command Skin:   Right lower extremity skin appears unremarkable.  Right foot warm Neurologic:   Sensation grossly intact right foot Psychiatric:   judgement and insight appear normal  Mental status ? Mood, affect appropriate  I have personally reviewed the following:   Today's Data  CBG stable, BUN slightly elevated at 34, creatinine slightly elevated 1.29.  High-sensitivity troponin is negative.  CBC unremarkable CT head negative CT head noted   Scheduled Meds: . aspirin EC  81 mg Oral Daily  . atorvastatin  40 mg Oral Daily  . carbidopa-levodopa  0.5-1 tablet Oral QID  . enoxaparin (LOVENOX) injection  40 mg Subcutaneous Q24H  . folic acid  3 mg Oral Daily  . insulin aspart  0-9 Units Subcutaneous Q4H  . PARoxetine  40 mg Oral Daily  . rivastigmine  1.5 mg Oral BID  . tamsulosin  0.4 mg Oral Daily   Continuous Infusions: . sodium chloride 75 mL/hr at 08/05/19 1500    Principal Problem:   Closed right acetabular fracture (HCC) Active Problems:   Chronic diarrhea   Type 2 diabetes mellitus without complication (HCC)   Essential hypertension   Parkinson disease (HCC)   Cognitive deficit due to Parkinson's disease ([place])   Syncope   Hypotension   AKI (acute kidney injury) ([place])   Acetabular fracture ([place])   LOS: 0 days   How to contact the [facility] Attending or Consulting provider 7A - 7P or covering provider during after hours [place], for this patient?  1. Check the care team in [facility] and look for a) attending/consulting TRH provider listed and b) the [facility] team listed 2. Log into [email or web address] and use [place] universal password to access. If you do not have the password, please contact the hospital operator. 3. Locate the [facility] provider you are looking for under [facility] and page to a number that you can be directly reached. 4. If you still have difficulty reaching the provider, please page the [facility] (Director on Call) for the Hospitalists listed on amion for assistance.

## 2019-08-05 NOTE — H&P (Signed)
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  His wife who gives most of the history said he got up out of bed and as he was leaving the bedroom he passed out and fell onto his hip.  She states that patient has been having worsening diarrhea for the past 6 months related to his IBS and was very cautious about what he was eating and drinking and has been having decreased oral intake as a result.  ED Course: On arrival to the emergency room he had a soft blood pressure of 100/65 which dipped as low as systolic in the [id number] but rebounded following IV fluids hydration.  Blood work significant for a slightly elevated creatinine of 1.29 above his baseline of 0.9.  CT of the hip showed a highly comminuted and displaced complex right acetabular fracture.  The emergency room provider spoke with orthopedist, Dr. Roland Rack who stated that the fracture was too complex to be managed at this hospital.  They subsequently called several hospitals including [place], [place], [place] all of whom had no bed availability.  [facility] did place patient on the waiting list and expects patient to have a bed in over 12 hours so patient will be admitted to this hospital while awaiting transfer to [facility].  Dr. Roland Rack is aware.  Review of Systems: negative except as stated in HPI  Past Medical History:  Diagnosis Date  . Cancer ([place])   . Dementia ([place])   . Diabetes mellitus without complication ([place])   . Hypertension    . Prostate cancer [facility])     History reviewed. No pertinent surgical history.   reports that he has quit smoking. He has never used smokeless tobacco. He reports previous alcohol use. No history on file for drug.  No Known Allergies  History reviewed. No pertinent family history.   Prior to Admission medications   Medication Sig Start Date End Date Taking? Authorizing Provider  aspirin EC 81 MG tablet Take 81 mg by mouth daily.    [provider]  atorvastatin (LIPITOR) 40 MG tablet Take 40 mg by mouth daily.    [provider]  carbidopa-levodopa (SINEMET IR) 25-100 MG tablet Take 0.5-1 tablets by mouth 4 (four) times daily.    [provider]  diclofenac Sodium (VOLTAREN) 1 % GEL Apply topically 4 (four) times daily.    [provider]  folic acid (FOLVITE) 1 MG tablet Take 3 mg by mouth daily.    [provider]  lisinopril (ZESTRIL) 20 MG tablet Take 20 mg by mouth daily.    [provider]  metFORMIN (GLUCOPHAGE) 1000 MG tablet Take 1,000 mg by mouth 2 (two) times daily with a meal.    [provider]  Methotrexate 25 MG/ML SOSY Inject 0.8 mLs into the skin once a week.    [provider]  PARoxetine (PAXIL) 40 MG tablet Take 40 mg by mouth daily.    [provider]  rivastigmine (EXELON) 1.5 MG capsule Take  1.5 mg by mouth 2 (two) times daily.    [provider]  tamsulosin (FLOMAX) 0.4 MG CAPS capsule Take 0.4 mg by mouth.    [provider]    Physical Exam: Vitals:   08/05/19 0130 08/05/19 0145 08/05/19 0200 08/05/19 0207  BP: (!) 86/72 107/62 (!) 100/51   Pulse: 78 87 75   Resp: 15 13 14    Temp:    98.3 F (36.8 C)  TempSrc:    Oral  SpO2: 98% 97% 96%   Weight:      Height:         Vitals:   08/05/19 0130 08/05/19 0145 08/05/19 0200 08/05/19 0207  BP: (!) 86/72 107/62 (!) 100/51   Pulse: 78 87 75   Resp: 15 13 14    Temp:    98.3 F (36.8 C)  TempSrc:    Oral    SpO2: 98% 97% 96%   Weight:      Height:        Constitutional: Alert and awake, oriented x2, in pain discomfort Eyes: PERLA, EOMI, irises appear normal, anicteric sclera,  ENMT: external ears and nose appear normal, normal hearing             Lips appears normal, oropharynx mucosa, tongue, posterior pharynx appear normal  Neck: neck appears normal, no masses, normal ROM, no thyromegaly, no JVD  CVS: S1-S2 clear, no murmur rubs or gallops,  , no carotid bruits, pedal pulses palpable, No LE edema Respiratory:  clear to auscultation bilaterally, no wheezing, rales or rhonchi. Respiratory effort normal. No accessory muscle use.  Abdomen: soft nontender, nondistended, normal bowel sounds, no hepatosplenomegaly, no hernias Musculoskeletal: : no cyanosis, clubbing , right leg shortened and externally rotated  neuro: Grossly intact  Skin: no rashes or lesions or ulcers, no induration or nodules   Labs on Admission: I have personally reviewed following labs and imaging studies  CBC: Recent Labs  Lab 08/04/19 2156  WBC 5.3  NEUTROABS 3.5  HGB 12.4*  HCT 38.2*  MCV 92.5  PLT [id number]   Basic Metabolic Panel: Recent Labs  Lab 08/04/19 2156  NA 138  K 4.6  CL 101  CO2 26  GLUCOSE 134*  BUN 34*  CREATININE 1.29*  CALCIUM 9.1   GFR: Estimated Creatinine Clearance: 51.6 mL/min (A) (by C-G formula based on SCr of 1.29 mg/dL (H)). Liver Function Tests: Recent Labs  Lab 08/04/19 2156  AST 24  ALT 19  ALKPHOS 80  BILITOT 0.8  PROT 7.1  ALBUMIN 4.3   No results for input(s): LIPASE, AMYLASE in the last 168 hours. No results for input(s): AMMONIA in the last 168 hours. Coagulation Profile: No results for input(s): INR, PROTIME in the last 168 hours. Cardiac Enzymes: No results for input(s): CKTOTAL, CKMB, CKMBINDEX, TROPONINI in the last 168 hours. BNP (last 3 results) No results for input(s): PROBNP in the last 8760 hours. HbA1C: No results for input(s): HGBA1C in the last  72 hours. CBG: No results for input(s): GLUCAP in the last 168 hours. Lipid Profile: No results for input(s): CHOL, HDL, LDLCALC, TRIG, CHOLHDL, LDLDIRECT in the last 72 hours. Thyroid Function Tests: No results for input(s): TSH, T4TOTAL, FREET4, T3FREE, THYROIDAB in the last 72 hours. Anemia Panel: No results for input(s): VITAMINB12, FOLATE, FERRITIN, TIBC, IRON, RETICCTPCT in the last 72 hours. Urine analysis: No results found for: COLORURINE, APPEARANCEUR, LABSPEC, PHURINE, GLUCOSEU, HGBUR, BILIRUBINUR, KETONESUR, PROTEINUR, UROBILINOGEN, NITRITE, LEUKOCYTESUR  Radiological Exams on Admission: CT Head Wo  Contrast  Result Date: 08/04/2019 CLINICAL DATA:  Fall EXAM: CT HEAD WITHOUT CONTRAST TECHNIQUE: Contiguous axial images were obtained from the base of the skull through the vertex without intravenous contrast. COMPARISON:  None. FINDINGS: Brain: There is no mass, hemorrhage or extra-axial collection. There is generalized atrophy without lobar predilection. Hypodensity of the white matter is most commonly associated with chronic microvascular disease. Vascular: No abnormal hyperdensity of the major intracranial arteries or dural venous sinuses. No intracranial atherosclerosis. Skull: The visualized skull base, calvarium and extracranial soft tissues are normal. Sinuses/Orbits: No fluid levels or advanced mucosal thickening of the visualized paranasal sinuses. No mastoid or middle ear effusion. The orbits are normal. IMPRESSION: Generalized atrophy and chronic microvascular ischemia without acute intracranial abnormality. Electronically Signed   By: Ulyses Jarred M.D.   On: 08/04/2019 22:30   CT Hip Right Wo Contrast  Result Date: 08/04/2019 CLINICAL DATA:  Post fall with right hip pain. Hip trauma, fracture suspected, no prior imaging EXAM: CT OF THE RIGHT HIP WITHOUT CONTRAST TECHNIQUE: Multidetector CT imaging of the right hip was performed according to the standard protocol. Multiplanar CT  image reconstructions were also generated. COMPARISON:  None. FINDINGS: Bones/Joint/Cartilage Highly comminuted complex acetabular fracture with multifocal articular involvement. There is articular distraction of 8 mm as well as step of up to 4 mm. There is slight protrusion of dominant fracture fragments. Fracture extends to involve the anterior iliac crest. Fracture involves the puboacetabular junction. There is a comminuted and displaced right inferior pubic ramus fracture. The sacroiliac joint is congruent were visualized. No definite right sacral fracture. Ligaments Suboptimally assessed by CT. Muscles and Tendons Intramuscular hemorrhage involving the iliopsoas and obturator muscles related to acetabular fracture. No evidence of gluteal hematoma. Soft tissues Mild soft tissue edema. Enlarged prostate gland is partially included. IMPRESSION: 1. Highly comminuted and displaced complex right acetabular fracture with multifocal articular involvement. 2. Fracture also involves the puboacetabular junction as well as extends superiorly to involve the anterior iliac crest. 3. Comminuted and displaced right inferior pubic ramus fracture. 4. Intramuscular hemorrhage involving the iliopsoas and obturator muscles related to acetabular fracture. Electronically Signed   By: Keith Rake M.D.   On: 08/04/2019 22:34    EKG: Independently reviewed.   Assessment/Plan Principal Problem:   Closed right acetabular fracture [facility]) -Patient on transplant list for [place] orthopedist, Dr. Roland Rack for assistance with management until patient is transferred -Pain control    Syncope -Suspect secondary to orthostatic hypotension from  diarrheal illness -Unknown whether patient has autonomic dysfunction from his Parkinson's -IV hydration -Cardiac monitoring to evaluate for arrhythmias -Can consider echocardiogram but at this time, syncopal event likely related to orthostatic hypotension  Hypotension, in the  setting of history of essential hypertension -Continue IV hydration -Hold home antihypertensives   Acute kidney injury -Creatinine 1.29 above baseline of 0.92 -Management as above with IV hydration  Irritable bowel syndrome with chronic diarrhea -Supportive care    Type 2 diabetes mellitus without complication (HCC) -Regular insulin sliding scale coverage    Parkinson disease ([place])   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

## 2019-08-05 NOTE — Plan of Care (Signed)
  Problem: Pain Managment: Goal: General experience of comfort will improve Outcome: Progressing   Problem: Safety: Goal: Ability to remain free from injury will improve Outcome: Progressing   

## 2019-08-05 NOTE — Evaluation (Signed)
Physical Therapy Evaluation Patient Details Name: Seth Mcpherson MRN: [id number] DOB: 31-Dec-1945 Today's Date: 08/05/2019   History of Present Illness  Per MD notes: Pt is a 74 y.o. male with a history of diabetes, hypertension, Parkinson's, dementia, irritable bowel syndrome, and prostate cancer.  The patient apparently had been having difficulty with a flareup of his irritable bowel syndrome symptoms over the past few months, resulting in increased episodes of diarrhea.  Apparently he went to get up from his bed when he became lightheaded and passed out, falling onto his right hip.  A CT scan of his pelvis was obtained which demonstrated a comminuted mildly displaced acetabular fracture.  Pt to be managed conservatively.    Clinical Impression  Pt somewhat lethargic during the session but responded to verbal cues and was able to follow simple commands with increased time and cuing.  Pt ultimately very limited functionally including being total A with bed mobility tasks and unable to maintain static sitting balance without occasional min to mod A.  Transfers not attempted this session secondary to pt being somewhat lethargic and unable to maintain independent sitting at the EOB and unlikely to safely maintain RLE NWB status.  Pt will benefit from PT services in a SNF setting upon discharge to safely address deficits listed in patient problem list for decreased caregiver assistance and eventual return to PLOF.     Follow Up Recommendations SNF    Equipment Recommendations  None recommended by PT    Recommendations for Other Services       Precautions / Restrictions Precautions Precautions: Fall Restrictions Weight Bearing Restrictions: Yes RLE Weight Bearing: Non weight bearing      Mobility  Bed Mobility Overal bed mobility: Needs Assistance Bed Mobility: Supine to Sit;Sit to Supine     Supine to sit: Total assist;+2 for physical assistance Sit to supine: +2 for physical  assistance;Total assist   General bed mobility comments: Pt not able to provide any meaningful amount of assistance with bed mobility tasks  Transfers                 General transfer comment: Unable/unsafe to attempt  Ambulation/Gait                Stairs            Wheelchair Mobility    Modified Rankin (Stroke Patients Only)       Balance Overall balance assessment: Needs assistance Sitting-balance support: Bilateral upper extremity supported;Feet supported Sitting balance-Leahy Scale: Poor Sitting balance - Comments: Pt required frequent min to mod A to prevent posterior lean/LOB Postural control: Posterior lean     Standing balance comment: Unable to stand                             Pertinent Vitals/Pain Pain Assessment: 0-10 Pain Score: 0-No pain Pain Location: Pt reported no pain at rest.  Was unable to rate pain with movement but presented with significant guarding and grimacing. Pain Descriptors / Indicators: Operative site guarding;Grimacing Pain Intervention(s): Premedicated before session;Monitored during session;Limited activity within patient's tolerance    Home Living Family/patient expects to be discharged to:: Private residence Living Arrangements: Spouse/significant other Available Help at Discharge: Family;Available 24 hours/day Type of Home: House Home Access: Ramped entrance     Home Layout: One level Home Equipment: Walker - 2 wheels;Walker - 4 wheels;Bedside commode;Wheelchair - manual;Cane - quad;Cane - single point      Prior  Function Level of Independence: Needs assistance   Gait / Transfers Assistance Needed: Pt Ind with amb splitting time evenly between amb without an AD and with use of a SPC           Hand Dominance        Extremity/Trunk Assessment   Upper Extremity Assessment Upper Extremity Assessment: Generalized weakness    Lower Extremity Assessment Lower Extremity Assessment:  Generalized weakness;RLE deficits/detail RLE: Unable to fully assess due to pain       Communication      Cognition Arousal/Alertness: Lethargic Behavior During Therapy: Flat affect Overall Cognitive Status: History of cognitive impairments - at baseline                                        General Comments      Exercises Total Joint Exercises Ankle Circles/Pumps: AROM;Both;10 reps;5 reps Long Arc Quad: AROM;Strengthening;Both;5 reps;10 reps Knee Flexion: AROM;Strengthening;Both;5 reps;10 reps Other Exercises Other Exercises: Extensive anterior weight shifting in sitting to address posterior instability   Assessment/Plan    PT Assessment Patient needs continued PT services  PT Problem List Decreased strength;Decreased activity tolerance;Decreased balance;Decreased mobility;Decreased knowledge of use of DME;Decreased safety awareness;Decreased knowledge of precautions       PT Treatment Interventions DME instruction;Gait training;Functional mobility training;Therapeutic activities;Therapeutic exercise;Balance training;Patient/family education    PT Goals (Current goals can be found in the Care Plan section)  Acute Rehab PT Goals Patient Stated Goal: To get back to walking PT Goal Formulation: With patient Time For Goal Achievement: 08/18/19 Potential to Achieve Goals: Fair    Frequency 7X/week   Barriers to discharge Inaccessible home environment;Decreased caregiver support      Co-evaluation               AM-PAC PT "6 Clicks" Mobility  Outcome Measure Help needed turning from your back to your side while in a flat bed without using bedrails?: Total Help needed moving from lying on your back to sitting on the side of a flat bed without using bedrails?: Total Help needed moving to and from a bed to a chair (including a wheelchair)?: Total Help needed standing up from a chair using your arms (e.g., wheelchair or bedside chair)?: Total Help  needed to walk in hospital room?: Total Help needed climbing 3-5 steps with a railing? : Total 6 Click Score: 6    End of Session   Activity Tolerance: Patient limited by pain Patient left: in bed;with call bell/phone within reach;with bed alarm set;with family/visitor present Nurse Communication: Mobility status;Weight bearing status PT Visit Diagnosis: Muscle weakness (generalized) (M62.81);Difficulty in walking, not elsewhere classified (R26.2);Pain Pain - Right/Left: Right Pain - part of body: Hip    Time: 1455-1540 PT Time Calculation (min) (ACUTE ONLY): 45 min   Charges:   PT Evaluation $PT Eval Moderate Complexity: 1 Mod PT Treatments $Therapeutic Exercise: 8-22 mins        D. Royetta Asal PT, DPT 08/05/19, 4:18 PM

## 2019-08-05 NOTE — TOC Progression Note (Signed)
Transition of Care [facility]) - Progression Note    Patient Details  Name: Seth Mcpherson MRN: [id number] Date of Birth: 1945-05-25  Transition of Care [facility] CM/SW [place], RN Phone Number: 08/05/2019, 10:28 AM  Clinical Narrative:     Reported patient may transfer to [facility] for surgery, Please re-consult if TOC needs.         Expected Discharge Plan and Services                                                 Social Determinants of Health (SDOH) Interventions    Readmission Risk Interventions No flowsheet data found.

## 2019-08-05 NOTE — Consult Note (Signed)
ORTHOPAEDIC CONSULTATION  REQUESTING PHYSICIAN: Seth Cota, MD  Chief Complaint:   Right hip pain.  History of Present Illness: Seth Mcpherson is a 74 y.o. male with a history of diabetes, hypertension, Parkinson's, dementia, irritable bowel syndrome, and prostate cancer who lives with his wife and often ambulates with an assistive device.  The patient apparently had been having difficulty with a flareup of his irritable bowel syndrome symptoms over the past few months, resulting in increased episodes of diarrhea.  Apparently he went to get up from his bed when he became lightheaded and passed out, falling onto his right hip.  He complains of significant pain and was brought to the emergency room by EMS.  A CT scan of his pelvis was obtained which demonstrated a comminuted mildly displaced acetabular fracture.  According to the patient's wife, the patient has been becoming increasingly combative while trying to assert his independence but that he tends to be quite amicable for the most part.  Past Medical History:  Diagnosis Date  . Cancer ([place])   . Dementia ([place])   . Diabetes mellitus without complication ([place])   . Hypertension   . Prostate cancer [facility])    History reviewed. No pertinent surgical history. Social History   Socioeconomic History  . Marital status: Unknown    Spouse name: Not on file  . Number of children: Not on file  . Years of education: Not on file  . Highest education level: Not on file  Occupational History  . Not on file  Tobacco Use  . Smoking status: Former Research scientist (life sciences)  . Smokeless tobacco: Never Used  Substance and Sexual Activity  . Alcohol use: Not Currently  . Drug use: Not on file  . Sexual activity: Not on file  Other Topics Concern  . Not on file  Social History Narrative  . Not on file   Social Determinants of Health   Financial Resource Strain:   . Difficulty of Paying Living  Expenses:   Food Insecurity:   . Worried About Charity fundraiser in the Last Year:   . Arboriculturist in the Last Year:   Transportation Needs:   . Film/video editor (Medical):   Marland Kitchen Lack of Transportation (Non-Medical):   Physical Activity:   . Days of Exercise per Week:   . Minutes of Exercise per Session:   Stress:   . Feeling of Stress :   Social Connections:   . Frequency of Communication with Friends and Family:   . Frequency of Social Gatherings with Friends and Family:   . Attends Religious Services:   . Active Member of Clubs or Organizations:   . Attends Archivist Meetings:   Marland Kitchen Marital Status:    History reviewed. No pertinent family history. No Known Allergies Prior to Admission medications   Medication Sig Start Date End Date Taking? Authorizing Provider  aspirin EC 81 MG tablet Take 81 mg by mouth daily.    [provider]  atorvastatin (LIPITOR) 40 MG tablet Take 40 mg by mouth daily.    [provider]  carbidopa-levodopa (SINEMET IR) 25-100 MG tablet Take 0.5-1 tablets by mouth 4 (four) times daily.    [provider]  diclofenac Sodium (VOLTAREN) 1 % GEL Apply topically 4 (four) times daily.    [provider]  folic acid (FOLVITE) 1 MG tablet Take 3 mg by mouth daily.    [provider]  lisinopril (ZESTRIL) 20 MG tablet Take 20  mg by mouth daily.    [provider]  metFORMIN (GLUCOPHAGE) 1000 MG tablet Take 1,000 mg by mouth 2 (two) times daily with a meal.    [provider]  Methotrexate 25 MG/ML SOSY Inject 0.8 mLs into the skin once a week.    [provider]  PARoxetine (PAXIL) 40 MG tablet Take 40 mg by mouth daily.    [provider]  rivastigmine (EXELON) 1.5 MG capsule Take 1.5 mg by mouth 2 (two) times daily.    [provider]  tamsulosin (FLOMAX) 0.4 MG CAPS capsule Take 0.4 mg by mouth.    [provider]   CT Head Wo  Contrast  Result Date: 08/04/2019 CLINICAL DATA:  Fall EXAM: CT HEAD WITHOUT CONTRAST TECHNIQUE: Contiguous axial images were obtained from the base of the skull through the vertex without intravenous contrast. COMPARISON:  None. FINDINGS: Brain: There is no mass, hemorrhage or extra-axial collection. There is generalized atrophy without lobar predilection. Hypodensity of the white matter is most commonly associated with chronic microvascular disease. Vascular: No abnormal hyperdensity of the major intracranial arteries or dural venous sinuses. No intracranial atherosclerosis. Skull: The visualized skull base, calvarium and extracranial soft tissues are normal. Sinuses/Orbits: No fluid levels or advanced mucosal thickening of the visualized paranasal sinuses. No mastoid or middle ear effusion. The orbits are normal. IMPRESSION: Generalized atrophy and chronic microvascular ischemia without acute intracranial abnormality. Electronically Signed   By: Seth Mcpherson M.D.   On: 08/04/2019 22:30   CT Hip Right Wo Contrast  Result Date: 08/04/2019 CLINICAL DATA:  Post fall with right hip pain. Hip trauma, fracture suspected, no prior imaging EXAM: CT OF THE RIGHT HIP WITHOUT CONTRAST TECHNIQUE: Multidetector CT imaging of the right hip was performed according to the standard protocol. Multiplanar CT image reconstructions were also generated. COMPARISON:  None. FINDINGS: Bones/Joint/Cartilage Highly comminuted complex acetabular fracture with multifocal articular involvement. There is articular distraction of 8 mm as well as step of up to 4 mm. There is slight protrusion of dominant fracture fragments. Fracture extends to involve the anterior iliac crest. Fracture involves the puboacetabular junction. There is a comminuted and displaced right inferior pubic ramus fracture. The sacroiliac joint is congruent were visualized. No definite right sacral fracture. Ligaments Suboptimally assessed by CT. Muscles and Tendons  Intramuscular hemorrhage involving the iliopsoas and obturator muscles related to acetabular fracture. No evidence of gluteal hematoma. Soft tissues Mild soft tissue edema. Enlarged prostate gland is partially included. IMPRESSION: 1. Highly comminuted and displaced complex right acetabular fracture with multifocal articular involvement. 2. Fracture also involves the puboacetabular junction as well as extends superiorly to involve the anterior iliac crest. 3. Comminuted and displaced right inferior pubic ramus fracture. 4. Intramuscular hemorrhage involving the iliopsoas and obturator muscles related to acetabular fracture. Electronically Signed   By: Keith Rake M.D.   On: 08/04/2019 22:34    Positive ROS: All other systems have been reviewed and were otherwise negative with the exception of those mentioned in the HPI and as above.  Physical Exam: General: Sleeping at this time Psychiatric:  Patient is not competent for consent   Cardiovascular:  No pedal edema Respiratory:  No wheezing, non-labored breathing GI:  Abdomen is soft and non-tender Skin:  No lesions in the area of chief complaint Neurologic:  Sensation intact distally Lymphatic:  No axillary or cervical lymphadenopathy  Orthopedic Exam:  Orthopedic examination is limited to the right hip and lower extremity.  The right lower extremity  appears to be appropriate aligned but perhaps might be slightly shorter as compared to the left.  Skin inspection around the right hip is unremarkable.  No swelling, erythema, ecchymosis, abrasions, or other skin abnormalities are identified.  Neurovascular examination is deferred given that the patient is sleeping.  X-rays:  A CT scan of the pelvis and right hip has been obtained and is available for review.  By report, the scan demonstrates evidence of a comminuted mildly displaced/impacted right acetabular fracture with compromise of the medial wall.  Most of the articular dome remains intact.   The femoral head exhibits mild medial protrusio, but otherwise is appropriately located within the acetabulum.  No significant degenerative changes are noted.  No lytic lesions or other bony abnormalities are identified.  These films have been reviewed by myself and discussed with the patient's wife and daughter, who are at the bedside.  Assessment: Mildly displaced/impacted comminuted right acetabular fracture.  Plan: The treatment options have been discussed with the patient's wife and daughter, including both surgical and nonsurgical choices.  The patient's wife and daughter would like to have the patient managed nonsurgically.  Apparently, they know Dr. Altamese Dundas, the traumatologist at [facility] in [place].  Somehow they were able to contact him and he was able to review the CT scan and agree that nonsurgical treatment would be appropriate in his case.    I explained to them that I do not treat a lot of acetabular fractures, but that if they were wished to manage him nonsurgically, that I would be able to supervise his care while he is here.  I have explained to them that nonsurgical treatment will include the patient being nonweightbearing for at least 2 months as he is not able to comprehend or maintain a toe-touch weightbearing status.  Therefore, there will be substantial risks of blood clots, bedsores, pneumonia, and progressive generalized deconditioning.  Furthermore, there is also a chance that the head could migrate further medially, requiring a change in treatment plans.  The patient's wife and daughter state their understanding and continue to desire nonsurgical management.  The patient will be managed with no weightbearing to the right lower extremity.  Appropriate pain medication may be prescribed as indicated clinically.  Most likely, the patient will require rehab placement for an extended period of time.  Thank you for asking me to participate in the care of this most  pleasant yet unfortunate patient.  I will be happy to follow him with you.   Pascal Lux, MD  Beeper #:  [phone]  08/05/2019 3:15 PM

## 2019-08-05 NOTE — Progress Notes (Signed)
OR to chaplain's office for Advanced Directive. Chaplain provided AD education and left packet with wife to be completed. Prayed with wife and daughter who were at bedside. Patient was sleep the entire visit.

## 2019-08-06 LAB — BASIC METABOLIC PANEL
Anion gap: 7 (ref 5–15)
BUN: 20 mg/dL (ref 8–23)
CO2: 27 mmol/L (ref 22–32)
Calcium: 8.1 mg/dL — ABNORMAL LOW (ref 8.9–10.3)
Chloride: 101 mmol/L (ref 98–111)
Creatinine, Ser: 0.75 mg/dL (ref 0.61–1.24)
GFR calc Af Amer: >60 mL/min (ref >60)
GFR calc non Af Amer: >60 mL/min (ref >60)
Glucose, Bld: 123 mg/dL — ABNORMAL HIGH (ref 70–99)
Potassium: 4 mmol/L (ref 3.5–5.1)
Sodium: 135 mmol/L (ref 135–145)

## 2019-08-06 LAB — GLUCOSE, CAPILLARY
Glucose-Capillary: 143 mg/dL — ABNORMAL HIGH (ref 70–99)
Glucose-Capillary: 109 mg/dL — ABNORMAL HIGH (ref 70–99)
Glucose-Capillary: 115 mg/dL — ABNORMAL HIGH (ref 70–99)
Glucose-Capillary: 121 mg/dL — ABNORMAL HIGH (ref 70–99)
Glucose-Capillary: 99 mg/dL (ref 70–99)
Glucose-Capillary: 134 mg/dL — ABNORMAL HIGH (ref 70–99)

## 2019-08-06 MED ORDER — HYDROMORPHONE HCL 1 MG/ML IJ SOLN
0.5000 mg | INTRAMUSCULAR | Status: DC | PRN
  Administered 2019-08-06 – 2019-08-08 (×8): 0.5 mg via INTRAVENOUS
  Filled 2019-08-06 (×9): qty 1

## 2019-08-06 MED ORDER — ACETAMINOPHEN 325 MG PO TABS: 650.0000 mg | ORAL_TABLET | Freq: Four times a day (QID) | ORAL | Status: DC | PRN

## 2019-08-06 NOTE — TOC Initial Note (Signed)
Transition of Care [facility]) - Initial/Assessment Note    Patient Details  Name: Seth Mcpherson MRN: [id number] Date of Birth: Aug 25, 1945  Transition of [facility]) CM/SW Contact:    Gelene Mink, [place] Phone Number: 08/06/2019, 11:53 AM  Clinical Narrative:                  CSW called and spoke with the patient over the phone. CSW introduced herself, explained her role, and shared therapy recommendation. CSW explained the SNF process. Patient is agreeable to rehab, he stated he wanted to speak with his wife. CSW obtained permission to fax the patient out and provide bed offers. CSW messaged MD to put in a CIR consult. Once a decision has been made between SNF or CIR, insurance authorization will need to be started. The patient is medically ready for discharge.   CSW completed the patient's FL2, but the patient's PASSR is under manual review. Additional paperwork will need to be uploaded to [facility] when requested.   CSW will continue to follow and assist with TOC needs.   Expected Discharge Plan: (SNF vs. CIR) Barriers to Discharge: Continued Medical Work up   Patient Goals and CMS Choice Patient states their goals for this hospitalization and ongoing recovery are:: Pt is agreeable to rehab CMS Medicare.gov Compare Post Acute Care list provided to:: Patient Choice offered to / list presented to : Patient  Expected Discharge Plan and Services Expected Discharge Plan: (SNF vs. CIR)     Post Acute Care Choice: (SNF vs. CIR) Living arrangements for the past 2 months: Single Family Home                                      Prior Living Arrangements/Services Living arrangements for the past 2 months: Single Family Home Lives with:: Spouse Patient language and need for interpreter reviewed:: No Do you feel safe going back to the place where you live?: Yes      Need for Family Participation in Patient Care: Yes (Comment) Care giver support system in place?: Yes (comment)    Criminal Activity/Legal Involvement Pertinent to Current Situation/Hospitalization: No - Comment as needed  Activities of Daily Living Home Assistive Devices/Equipment: Blood pressure cuff, Walker (specify type), Grab bars in shower, Eyeglasses ADL Screening (condition at time of admission) Patient's cognitive ability adequate to safely complete daily activities?: Yes Is the patient deaf or have difficulty hearing?: No Does the patient have difficulty seeing, even when wearing glasses/contacts?: No Does the patient have difficulty concentrating, remembering, or making decisions?: No Patient able to express need for assistance with ADLs?: Yes Does the patient have difficulty dressing or bathing?: No Independently performs ADLs?: No Communication: Independent Dressing (OT): Independent Grooming: Independent Feeding: Independent Bathing: Independent Toileting: Needs assistance Is this a change from baseline?: Pre-admission baseline In/Out Bed: Needs assistance Is this a change from baseline?: Pre-admission baseline Walks in Home: Needs assistance Is this a change from baseline?: Pre-admission baseline Does the patient have difficulty walking or climbing stairs?: Yes Weakness of Legs: Both Weakness of Arms/Hands: None  Permission Sought/Granted Permission sought to share information with : Case Manager Permission granted to share information with : Yes, Release of Information Signed     Permission granted to share info w AGENCY: All SNF        Emotional Assessment Appearance:: Appears stated age Attitude/Demeanor/Rapport: Engaged Affect (typically observed): Calm Orientation: : Oriented to  Self, Oriented to Place, Oriented to  Time, Oriented to Situation Alcohol / Substance Use: Not Applicable Psych Involvement: No (comment)  Admission diagnosis:  Syncope [R55] AKI (acute kidney injury) ([place]) [N17.9] Syncope, unspecified syncope type [R55] Closed displaced fracture of  right acetabulum, unspecified portion of acetabulum, initial encounter ([place]) [S32.401A] Acetabular fracture ([place]) [S32.409A] Patient Active Problem List   Diagnosis Date Noted  . Chronic diarrhea 08/05/2019  . Closed right acetabular fracture ([place]) 08/05/2019  . Type 2 diabetes mellitus without complication ([place]) 0000000  . Essential hypertension 08/05/2019  . Parkinson disease ([place]) 08/05/2019  . Cognitive deficit due to Parkinson's disease ([place]) 08/05/2019  . Syncope 08/05/2019  . Hypotension 08/05/2019  . AKI (acute kidney injury) ([place]) 08/05/2019  . Acetabular fracture ([place]) 08/05/2019   PCP:  System, Pcp Not In Pharmacy:   [facility] [address], [place] - [place] [address] Phone: [phone] Fax: [phone]     Social Determinants of Health (SDOH) Interventions    Readmission Risk Interventions No flowsheet data found.

## 2019-08-06 NOTE — Plan of Care (Signed)
  Problem: Pain Managment: Goal: General experience of comfort will improve Outcome: Progressing   Problem: Safety: Goal: Ability to remain free from injury will improve Outcome: Progressing   

## 2019-08-06 NOTE — Progress Notes (Signed)
Physical Therapy Treatment Patient Details Name: Seth Mcpherson MRN: [id number] DOB: 05-31-1945 Today's Date: 08/06/2019    History of Present Illness Per MD notes: Pt is a 74 y.o. male with a history of diabetes, hypertension, Parkinson's, dementia, irritable bowel syndrome, and prostate cancer.  The patient apparently had been having difficulty with a flareup of his irritable bowel syndrome symptoms over the past few months, resulting in increased episodes of diarrhea.  Apparently he went to get up from his bed when he became lightheaded and passed out, falling onto his right hip.  A CT scan of his pelvis was obtained which demonstrated a comminuted mildly displaced acetabular fracture.  Pt to be managed conservatively.    PT Comments    Patient agrees to PT treatment. He performs rolling to the left side with pillow between knees, with max assist x 3 attempts. He is able to stay on his side once he is positioned but has increased pain in R hip with all movement. He is assisted from sidelying to sitting using the head of the bed to raise him up partially, he then needs max assit to get into sitting but is not able to stay in sitting due to pain and weakness. He is max assist to be assisted sit to supine. He is shaking from pain and his bed is wet. Nsg was contacted about patient requesting pain meds and also about needing his bedding changed. Daughter was present but stepped out of the room during treatment. Patient is not tolerating treatment due to high pain. Pt will be following again to attempt treatment if patient is able to participate.   Follow Up Recommendations  SNF     Equipment Recommendations  None recommended by PT    Recommendations for Other Services       Precautions / Restrictions Restrictions Weight Bearing Restrictions: Yes RLE Weight Bearing: Non weight bearing    Mobility  Bed Mobility Overal bed mobility: Needs Assistance Bed Mobility: Sidelying to Sit;Supine to  Sit;Sit to Supine   Sidelying to sit: Max assist Supine to sit: Max assist Sit to supine: Max assist   General bed mobility comments: pain limiting mobility  Transfers Overall transfer level: (unable)                  Ambulation/Gait                 Stairs             Wheelchair Mobility    Modified Rankin (Stroke Patients Only)       Balance Overall balance assessment: Needs assistance Sitting-balance support: Bilateral upper extremity supported;Feet supported Sitting balance-Leahy Scale: Poor                                      Cognition Arousal/Alertness: Awake/alert Behavior During Therapy: Flat affect Overall Cognitive Status: Within Functional Limits for tasks assessed                                        Exercises      General Comments        Pertinent Vitals/Pain Pain Assessment: 0-10 Pain Score: 7  Pain Location: (right hip) Pain Descriptors / Indicators: Aching Pain Intervention(s): Limited activity within patient's tolerance;Monitored during session;Patient requesting pain meds-RN notified  Home Living                      Prior Function            PT Goals (current goals can now be found in the care plan section) Acute Rehab PT Goals Patient Stated Goal: no goals stated PT Goal Formulation: With patient Time For Goal Achievement: 08/18/19 Potential to Achieve Goals: Fair Progress towards PT goals: Not progressing toward goals - comment(Patient is limited by pain. )    Frequency    7X/week      PT Plan Current plan remains appropriate    Co-evaluation              AM-PAC PT "6 Clicks" Mobility   Outcome Measure  Help needed turning from your back to your side while in a flat bed without using bedrails?: Total Help needed moving from lying on your back to sitting on the side of a flat bed without using bedrails?: Total Help needed moving to and from a bed  to a chair (including a wheelchair)?: Total Help needed standing up from a chair using your arms (e.g., wheelchair or bedside chair)?: Total Help needed to walk in hospital room?: Total Help needed climbing 3-5 steps with a railing? : Total 6 Click Score: 6    End of Session   Activity Tolerance: Patient limited by pain Patient left: in bed;with call bell/phone within reach Nurse Communication: Mobility status PT Visit Diagnosis: Muscle weakness (generalized) (M62.81) Pain - Right/Left: Right Pain - part of body: Hip     Time: [id number] PT Time Calculation (min) (ACUTE ONLY): 22 min  Charges:  $Therapeutic Activity: 8-22 mins                       Alanson Puls , PT DPT 08/06/2019, 10:51 AM

## 2019-08-06 NOTE — Progress Notes (Signed)
Medically stable. Will need SNF or CIR.

## 2019-08-06 NOTE — Progress Notes (Signed)
Subjective: The patient complains of moderate to severe pain with attempted movement of the hip, but otherwise has no complaints.   Objective: Vital signs in last 24 hours: Temp:  [97.5 F (36.4 C)-99.3 F (37.4 C)] 98.6 F (37 C) (05/01 0506) Pulse Rate:  [72-85] 75 (05/01 0829) Resp:  [17-20] 20 (05/01 0506) BP: (108-120)/(65-72) 113/69 (05/01 0829) SpO2:  [92 %-97 %] 97 % (05/01 0829)  Intake/Output from previous day: 04/30 0701 - 05/01 0700 In: 1855.5 [P.O.:480; I.V.:1375.5] Out: 100 [Urine:100] Intake/Output this shift: No intake/output data recorded.  Recent Labs    08/04/19 2156  HGB 12.4*   Recent Labs    08/04/19 2156  WBC 5.3  RBC 4.13*  HCT 38.2*  PLT 166   Recent Labs    08/04/19 2156 08/06/19 0856  NA 138 135  K 4.6 4.0  CL 101 101  CO2 26 27  BUN 34* 20  CREATININE 1.29* 0.75  GLUCOSE 134* 123*  CALCIUM 9.1 8.1*   No results for input(s): LABPT, INR in the last 72 hours.  Physical Exam: Skin inspection around the right hip again is notable for mild swelling, but otherwise is unremarkable.  He has some tenderness to palpation over the anterior more so than lateral aspects of the hip.  He has more severe pain with any attempted active or passive motion of the hip.  He is neurovascularly intact to the right lower extremity and foot.  Assessment: Comminuted minimally displaced right acetabular fracture.  Plan: The treatment options have been reviewed with the patient and his daughter, who is at the bedside.  The patient will continue to work with physical therapy on mobilization while maintaining a nonweightbearing status on the right leg.  He may be given pain medication as deemed clinically appropriate.  He will need rehab placement.   Marshall Cork Caellum Mancil 08/06/2019, 10:59 AM

## 2019-08-06 NOTE — NC FL2 (Signed)
[name] LEVEL OF CARE SCREENING TOOL     IDENTIFICATION  Patient Name: Seth Mcpherson Birthdate: 11-Feb-1946 Sex: male Admission Date (Current Location): 08/04/2019  [place] and [place] Number:  Engineering geologist and Address:  [facility], [address], [place], [place] [address]      Provider Number: [id number]  Attending Physician Name and Address:  Samuella Cota, MD  Relative Name and Phone Number:  Jalon, [place], [phone]    Current Level of Care: Hospital Recommended Level of Care: [place] Prior Approval Number:    Date Approved/Denied:   PASRR Number: Under Manual Review  Discharge Plan: SNF    Current Diagnoses: Patient Active Problem List   Diagnosis Date Noted  . Chronic diarrhea 08/05/2019  . Closed right acetabular fracture ([name]) 08/05/2019  . Type 2 diabetes mellitus without complication ([name]) 0000000  . Essential hypertension 08/05/2019  . Parkinson disease ([name]) 08/05/2019  . 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 Syncope 08/05/2019  . Hypotension 08/05/2019  . AKI (acute kidney injury) ([name]) 08/05/2019  . Acetabular fracture ([name]) 08/05/2019    Orientation RESPIRATION BLADDER Height & Weight     Self, Time, Situation, Place  Normal Continent, External catheter Weight: 72.6 kg Height:  5\' 11"  (180.3 cm)  BEHAVIORAL SYMPTOMS/MOOD NEUROLOGICAL BOWEL NUTRITION STATUS      Continent Diet(Regular diet, thin liquids)  AMBULATORY STATUS COMMUNICATION OF NEEDS Skin   Extensive Assist Verbally Normal                       Personal Care Assistance Level of Assistance  Bathing, Feeding, Dressing Bathing Assistance: Maximum assistance Feeding assistance: Independent Dressing Assistance: Maximum assistance     Functional Limitations Info  Sight, Hearing, Speech Sight Info: Adequate Hearing Info: Adequate Speech Info: Adequate    SPECIAL  CARE FACTORS FREQUENCY  PT (By licensed PT), OT (By licensed OT)     PT Frequency: 7X week OT Frequency: 7x Week            Contractures Contractures Info: Not present    Additional Factors Info  Code Status, Allergies, Psychotropic, Insulin Sliding Scale Code Status Info: Full Code Allergies Info: No Known Allergies Psychotropic Info: Paxil 40mg  daily Insulin Sliding Scale Info: insulin aspart novolog 0-9 units, every 4 hours       Current Medications (08/06/2019):  This is the current hospital active medication list Current Facility-Administered Medications  Medication Dose Route Frequency Provider Last Rate Last Admin  . 0.9 %  sodium chloride infusion   Intravenous Continuous Athena Masse, MD 75 mL/hr at 08/06/19 0552 New Bag at 08/06/19 0552  . aspirin EC tablet 81 mg  81 mg Oral Daily Athena Masse, MD   81 mg at 08/06/19 1024  . atorvastatin (LIPITOR) tablet 40 mg  40 mg Oral Daily Athena Masse, MD   40 mg at 08/06/19 1024  . carbidopa-levodopa (SINEMET IR) 25-100 MG per tablet immediate release 0.5-1 tablet  0.5-1 tablet Oral QID Athena Masse, MD   1 tablet at 08/06/19 1024  . enoxaparin (LOVENOX) injection 40 mg  40 mg Subcutaneous Q24H Judd Gaudier V, MD   40 mg at [id number]  . folic acid (FOLVITE) tablet 3 mg  3 mg Oral Daily Athena Masse, MD   3 mg at 08/06/19 1023  . HYDROcodone-acetaminophen (NORCO/VICODIN) 5-325 MG per tablet 1-2 tablet  1-2 tablet Oral Q6H  PRN Athena Masse, MD   2 tablet at 08/06/19 1024  . HYDROmorphone (DILAUDID) injection 0.5 mg  0.5 mg Intravenous Q4H PRN Samuella Cota, MD   0.5 mg at 08/06/19 1117  . insulin aspart (novoLOG) injection 0-9 Units  0-9 Units Subcutaneous Q4H Athena Masse, MD   1 Units at 08/06/19 0250  . PARoxetine (PAXIL) tablet 40 mg  40 mg Oral Daily Samuella Cota, MD   40 mg at 08/06/19 1024  . rivastigmine (EXELON) capsule 1.5 mg  1.5 mg Oral BID Judd Gaudier V, MD   1.5 mg at 08/06/19 1025   . tamsulosin (FLOMAX) capsule 0.4 mg  0.4 mg Oral Daily Athena Masse, MD   0.4 mg at 08/06/19 1024     Discharge Medications: Please see discharge summary for a list of discharge medications.  Relevant Imaging Results:  Relevant Lab Results:   Additional Information SSN: [id number]; COVID negative on 08/05/19  Adarsh Mundorf B Charlcie Prisco, LCSWA

## 2019-08-06 NOTE — Progress Notes (Signed)
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  Found to have complex acetabular fracture, orthopedics at [facility] felt patient needed higher level of care.  No beds available at [place] or [facility] at that time, placed on [facility] waitlist.  Patient admitted for pain control until transfer to [facility] accomplished.  A & P  Closed right acetabular fracture, complex --Per PT note pain is poorly controlled.  Patient does have some dementia which limits his history. --Although toe-touch weightbearing is technically allowed, the patient is not able to perform this secondary to multiple factors.  Therefore orthopedics has recommended no weightbearing right lower extremity.  Syncope secondary to autonomic instability, possible orthostatic hypotension with mild dehydration, probably related to diarrhea. Telemetry unremarkable, high-sensitivity troponin negative, no EKG performed. --No further evaluation suggested  Dehydration --Resolved.  Diabetes mellitus type 2 --CBG stable.  Continue sliding scale insulin.  Parkinson's disease --Continue Sinemet, Exelon  Irritable bowel syndrome with chronic diarrhea --Continue supportive care  Disposition Plan:   Status is: Inpatient  Remains inpatient appropriate because:Ongoing active pain requiring inpatient pain management  Dispo: The patient is from: Home              Anticipated d/c is to: To be determined              Anticipated d/c date is: 2 days              Patient currently is not medically stable to d/c.  DVT prophylaxis: enoxaparin Code Status: Full Family Communication: wife, daughter at bedside 5/1  Murray Hodgkins, MD  [facility] Direct contact: see [email or web address] (further directions at  bottom of note if needed) 7PM-7AM contact night coverage as at bottom of note 08/06/2019, 1:53 PM  LOS: 1 day    Consults:  . Orthopedics   Interval History/Subjective  Seems to feel okay right now.  Does continue to have pain.  Objective   Vitals:  Vitals:   08/06/19 0829 08/06/19 1132  BP: 113/69 (!) 94/56  Pulse: 75 79  Resp:  18  Temp:  98.8 F (37.1 C)  SpO2: 97% 93%    Exam:  Constitutional.  Appears calm, comfortable. Respiratory.  Clear to auscultation bilaterally.  No wheezes, rales or rhonchi.  Normal respiratory effort. Cardiovascular.  Regular rate and rhythm.  No murmur, rub or gallop.  No lower extremity edema.  Dorsalis pedis pulse 2+ right lower extremity. Musculoskeletal.  Moves right leg. Skin right lower extremity appears unremarkable. Psychiatric.  Grossly normal mood and affect.  Speech fluent and appropriate.  I have personally reviewed the following:   Today's Data  CBG stable BMP unremarkable   Scheduled Meds: . aspirin EC  81 mg Oral Daily  . atorvastatin  40 mg Oral Daily  . carbidopa-levodopa  0.5-1 tablet Oral QID  . enoxaparin (LOVENOX) injection  40 mg Subcutaneous Q24H  . folic acid  3 mg Oral Daily  . insulin aspart  0-9 Units Subcutaneous Q4H  . PARoxetine  40 mg Oral Daily  . rivastigmine  1.5 mg Oral BID  . tamsulosin  0.4 mg Oral Daily   Continuous Infusions: . 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  1. Check the care team in [facility]  and look for a) attending/consulting TRH provider listed and b) the [facility] team listed 2. Log into [email or web address] and use [place]'s universal password to access. If you do not have the password, please contact the hospital operator. 3. Locate the [facility] provider you are looking for under [facility] and page to a number that you can be directly reached. 4. If you still have difficulty reaching the provider, please page the [facility] (Director on Call) for the Hospitalists listed on amion for assistance.

## 2019-08-07 LAB — GLUCOSE, CAPILLARY
Glucose-Capillary: 103 mg/dL — ABNORMAL HIGH (ref 70–99)
Glucose-Capillary: 108 mg/dL — ABNORMAL HIGH (ref 70–99)
Glucose-Capillary: 111 mg/dL — ABNORMAL HIGH (ref 70–99)
Glucose-Capillary: 114 mg/dL — ABNORMAL HIGH (ref 70–99)
Glucose-Capillary: 127 mg/dL — ABNORMAL HIGH (ref 70–99)
Glucose-Capillary: 134 mg/dL — ABNORMAL HIGH (ref 70–99)
Glucose-Capillary: 90 mg/dL (ref 70–99)

## 2019-08-07 NOTE — Progress Notes (Signed)
Subjective: According to the patient's wife, the patient had a fairly difficult late night last night due to increased confusion and restlessness.  The patient does seem better this morning, according to the patient's wife.  He is pleasant and appropriately responsive to my questions.  He still notes moderate to severe pain in his hip with any attempted motion of the hip.   Objective: Vital signs in last 24 hours: Temp:  [98.4 F (36.9 C)-99.2 F (37.3 C)] 98.4 F (36.9 C) (05/02 1051) Pulse Rate:  [74-89] 83 (05/02 1051) Resp:  [18-19] 18 (05/02 1051) BP: (105-122)/(65-76) 120/69 (05/02 1051) SpO2:  [93 %-96 %] 93 % (05/02 1051)  Intake/Output from previous day: 05/01 0701 - 05/02 0700 In: 1165.8 [P.O.:240; I.V.:925.8] Out: 1450 [Urine:1450] Intake/Output this shift: Total I/O In: 240 [P.O.:240] Out: -   Recent Labs    08/04/19 2156  HGB 12.4*   Recent Labs    08/04/19 2156  WBC 5.3  RBC 4.13*  HCT 38.2*  PLT 166   Recent Labs    08/04/19 2156 08/06/19 0856  NA 138 135  K 4.6 4.0  CL 101 101  CO2 26 27  BUN 34* 20  CREATININE 1.29* 0.75  GLUCOSE 134* 123*  CALCIUM 9.1 8.1*   No results for input(s): LABPT, INR in the last 72 hours.  Physical Exam: On examination, the findings are essentially unchanged.  He remains neurovascularly intact to the right lower extremity and foot.  Assessment: Mildly displaced right acetabular fracture.  Plan: The treatment options have been discussed with the patient and his wife.  We will continue nonsurgical management to include progressive mobilization with physical therapy as symptoms permit and appropriate pain medication.   Marshall Cork Taneil Lazarus 08/07/2019, 12:40 PM

## 2019-08-07 NOTE — Progress Notes (Addendum)
Physical Therapy Treatment Patient Details Name: Seth Mcpherson MRN: [id number] DOB: May 06, 1945 Today's Date: 08/07/2019    History of Present Illness Per MD notes: Pt is a 74 y.o. male with a history of diabetes, hypertension, Parkinson's, dementia, irritable bowel syndrome, and prostate cancer.  The patient apparently had been having difficulty with a flareup of his irritable bowel syndrome symptoms over the past few months, resulting in increased episodes of diarrhea.  Apparently he went to get up from his bed when he became lightheaded and passed out, falling onto his right hip.  A CT scan of his pelvis was obtained which demonstrated a comminuted mildly displaced acetabular fracture.  Pt to be managed conservatively.    PT Comments    Condom cath has fallen off and bed saturated with urine.  Tech in to assist with care and full linen change.   Requires max a x 1 rolling left/right with tactile cues to reach and place and on rail.  Poor trunk rotation limits ability to roll/assist.  Once side lying, max a is needed to hold position left and right.  Participated in exercises as described below.  He is assisted to sitting with max a x 2.  Overall improved tolerance and decreased pain today.  Remains sitting 5 minutes with mod a x 1 to remain upright due to post lean.  Returned to supine with max a x 2.  Standing/transfers deferred due to assist level and inability to maintain NWB to allow for proper healing.  Pt may tolerate sliding board transfers when balance and strength improves.  Wife in room.  Very involved in care.  Wishes to speak to SWS/Care manager tomorrow regarding some family dynamic issues and discharge planning.  Encouraged wife to speak with RN in am to relay message to SWS/CM regarding her request for an extended conversation with them tomorrow.  CIR had been considered in the past but given tolerance to therapy SNF is more appropriate.  Wife in and agrees that CIR is not appropriate at  this time.  Discussed with L.Staley from CIR via secure message.     Follow Up Recommendations  SNF     Equipment Recommendations  None recommended by PT    Recommendations for Other Services       Precautions / Restrictions Restrictions Weight Bearing Restrictions: Yes RLE Weight Bearing: Non weight bearing    Mobility  Bed Mobility Overal bed mobility: Needs Assistance Bed Mobility: Rolling;Supine to Sit;Sit to Supine Rolling: Max assist   Supine to sit: Max assist;+2 for physical assistance Sit to supine: Max assist;+2 for physical assistance   General bed mobility comments: not limited by pain today, limited by fatigue  Transfers                 General transfer comment: Unable/unsafe to attempt  Ambulation/Gait                 Stairs             Wheelchair Mobility    Modified Rankin (Stroke Patients Only)       Balance Overall balance assessment: Needs assistance Sitting-balance support: Bilateral upper extremity supported;Feet supported Sitting balance-Leahy Scale: Poor Sitting balance - Comments: mod a at all times to prevent post LOB Postural control: Posterior lean     Standing balance comment: Unable to stand  Cognition Arousal/Alertness: Awake/alert Behavior During Therapy: WFL for tasks assessed/performed Overall Cognitive Status: Within Functional Limits for tasks assessed                                        Exercises Other Exercises Other Exercises: Bed  chaned due to inc urine and condom cath came off. Other Exercises: RLE supine AAROM - heel slides, ab/add and SLR, seated LAQ BLE    General Comments        Pertinent Vitals/Pain Pain Assessment: Faces Faces Pain Scale: Hurts a little bit Pain Location: generally comfortable today Pain Descriptors / Indicators: Sore Pain Intervention(s): Limited activity within patient's tolerance;Monitored during  session;Premedicated before session;RN gave pain meds during session    Home Living                      Prior Function            PT Goals (current goals can now be found in the care plan section) Progress towards PT goals: Progressing toward goals    Frequency    7X/week      PT Plan Current plan remains appropriate    Co-evaluation              AM-PAC PT "6 Clicks" Mobility   Outcome Measure  Help needed turning from your back to your side while in a flat bed without using bedrails?: Total Help needed moving from lying on your back to sitting on the side of a flat bed without using bedrails?: Total Help needed moving to and from a bed to a chair (including a wheelchair)?: Total Help needed standing up from a chair using your arms (e.g., wheelchair or bedside chair)?: Total Help needed to walk in hospital room?: Total Help needed climbing 3-5 steps with a railing? : Total 6 Click Score: 6    End of Session   Activity Tolerance: Patient tolerated treatment well Patient left: in bed;with call bell/phone within reach;with bed alarm set;with family/visitor present Nurse Communication: Mobility status Pain - Right/Left: Right Pain - part of body: Hip     Time: [id number] PT Time Calculation (min) (ACUTE ONLY): 26 min  Charges:  $Therapeutic Exercise: 8-22 mins $Therapeutic Activity: 8-22 mins                    Chesley Noon, PTA 08/07/19, 11:20 AM

## 2019-08-07 NOTE — Progress Notes (Addendum)
Inpatient Rehab Admissions Coordinator:   Pt. And wife are stating preference for SNF placement following discharge and therapy feels pt. Would be unable to tolerate 3 hours of therapy if admitted to CIR and are recommending SNF placement at this time as well. CIR will sign off at this time.   Clemens Catholic, [place], [place] Admissions Coordinator  [phone] ([place]) [phone] (office)

## 2019-08-07 NOTE — Progress Notes (Signed)
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  Found to have complex acetabular fracture, orthopedics at [facility] felt patient needed higher level of care.  No beds available at [place] or [facility] at that time, placed on [facility] waitlist.  Patient admitted for pain control until transfer to [facility] accomplished.  A & P  Closed right acetabular fracture, complex --Per PT note pain is poorly controlled.  Patient does have some dementia which limits his history. --Although toe-touch weightbearing is technically allowed, the patient is not able to perform this secondary to multiple factors.  Therefore orthopedics has recommended no weightbearing right lower extremity. --Continue pain control and PT.  Syncope secondary to autonomic instability, possible orthostatic hypotension with mild dehydration, probably related to diarrhea. Telemetry unremarkable, high-sensitivity troponin negative, no EKG performed. --No further evaluation suggested  Dehydration --Resolved.  Diabetes mellitus type 2 --CBG remains stable.  Continue sliding scale insulin.  Parkinson's disease with associated dementia. --Stable.  Continue Sinemet, Exelon  Irritable bowel syndrome with chronic diarrhea --Continue supportive care  Disposition Plan:   Status is: Inpatient  Remains inpatient appropriate because:Ongoing active pain requiring inpatient pain management  Dispo: The patient is from: Home              Anticipated d/c is to: SNF              Anticipated d/c date is: 5/3              Patient currently is not medically stable to d/c.  DVT prophylaxis: enoxaparin Code Status: Full Family Communication: wife at bedside 5/2  Murray Hodgkins, MD  [facility] Direct  contact: see [email or web address] (further directions at bottom of note if needed) 7PM-7AM contact night coverage as at bottom of note 08/07/2019, 2:05 PM  LOS: 2 days    Consults:  . Orthopedics   Interval History/Subjective  Reports feeling better.  Objective   Vitals:  Vitals:   08/07/19 0726 08/07/19 1051  BP: 122/75 120/69  Pulse: 74 83  Resp: 18 18  Temp: 99.1 F (37.3 C) 98.4 F (36.9 C)  SpO2: 94% 93%    Exam:  Constitutional.  Appears calm and comfortable. Respiratory.  Clear to auscultation bilaterally.  No wheezes, rales or rhonchi.  Normal respiratory effort. Cardiovascular.  Regular rate and rhythm.  No murmur, rub or gallop.  No lower extremity edema.  Dorsalis pedis pulse on the right within normal limits. Psychiatric.  Mildly confused.  I have personally reviewed the following:   Today's Data  CBG stable   Scheduled Meds: . aspirin EC  81 mg Oral Daily  . atorvastatin  40 mg Oral Daily  . carbidopa-levodopa  0.5-1 tablet Oral QID  . enoxaparin (LOVENOX) injection  40 mg Subcutaneous Q24H  . folic acid  3 mg Oral Daily  . insulin aspart  0-9 Units Subcutaneous Q4H  . PARoxetine  40 mg Oral Daily  . rivastigmine  1.5 mg Oral BID  . tamsulosin  0.4 mg Oral Daily   Continuous Infusions:   Principal Problem:   Closed right acetabular fracture (HCC) Active Problems:   Chronic diarrhea   Type 2 diabetes mellitus without complication (HCC)   Essential hypertension   Parkinson disease (HCC)   Cognitive deficit due to Parkinson's disease ([place])  Syncope   Hypotension   AKI (acute kidney injury) ([place])   Acetabular fracture ([place])   LOS: 2 days   How to contact the [facility] Attending or Consulting provider [place] or covering provider during after hours [place], for this patient?  1. Check the care team in [facility] and look for a) attending/consulting TRH provider listed and b) the [facility] team listed 2. Log into [email or web address] and use [place]'s universal password to  access. If you do not have the password, please contact the hospital operator. 3. Locate the [facility] provider you are looking for under [facility] and page to a number that you can be directly reached. 4. If you still have difficulty reaching the provider, please page the [facility] (Director on Call) for the Hospitalists listed on amion for assistance.

## 2019-08-08 ENCOUNTER — Encounter: Payer: Self-pay | Admitting: Family Medicine

## 2019-08-08 LAB — URINALYSIS, COMPLETE (UACMP) WITH MICROSCOPIC
Bacteria, UA: NONE SEEN
Bilirubin Urine: NEGATIVE
Glucose, UA: NEGATIVE mg/dL
Hgb urine dipstick: NEGATIVE
Ketones, ur: NEGATIVE mg/dL
Leukocytes,Ua: NEGATIVE
Nitrite: NEGATIVE
Protein, ur: NEGATIVE mg/dL
Specific Gravity, Urine: 1.02 (ref 1.005–1.030)
pH: 7 (ref 5.0–8.0)

## 2019-08-08 LAB — GLUCOSE, CAPILLARY
Glucose-Capillary: 101 mg/dL — ABNORMAL HIGH (ref 70–99)
Glucose-Capillary: 105 mg/dL — ABNORMAL HIGH (ref 70–99)
Glucose-Capillary: 113 mg/dL — ABNORMAL HIGH (ref 70–99)
Glucose-Capillary: 123 mg/dL — ABNORMAL HIGH (ref 70–99)
Glucose-Capillary: 141 mg/dL — ABNORMAL HIGH (ref 70–99)
Glucose-Capillary: 97 mg/dL (ref 70–99)

## 2019-08-08 LAB — RESPIRATORY PANEL BY RT PCR (FLU A&B, COVID)
Influenza A by PCR: NEGATIVE
Influenza B by PCR: NEGATIVE
SARS Coronavirus 2 by RT PCR: NEGATIVE

## 2019-08-08 MED ORDER — HYDROMORPHONE HCL 1 MG/ML IJ SOLN
0.5000 mg | INTRAMUSCULAR | Status: DC | PRN
  Administered 2019-08-08 (×2): 0.5 mg via INTRAVENOUS
  Filled 2019-08-08 (×2): qty 1

## 2019-08-08 MED ORDER — METHOTREXATE 25 MG/ML ~~LOC~~ SOSY
0.8000 mL | PREFILLED_SYRINGE | SUBCUTANEOUS | Status: DC
  Administered 2019-08-08: 0.8 mL via SUBCUTANEOUS

## 2019-08-08 MED ORDER — SENNOSIDES-DOCUSATE SODIUM 8.6-50 MG PO TABS
1.0000 | ORAL_TABLET | Freq: Every day | ORAL | Status: DC
  Administered 2019-08-09 – 2019-08-14 (×6): 1 via ORAL
  Filled 2019-08-08 (×6): qty 1

## 2019-08-08 MED ORDER — METHOTREXATE 25 MG/ML ~~LOC~~ SOSY
0.8000 mL | PREFILLED_SYRINGE | SUBCUTANEOUS | Status: DC
  Administered 2019-08-15: 0.8 mL via SUBCUTANEOUS
  Filled 2019-08-08 (×2): qty 0.8

## 2019-08-08 MED ORDER — HYDROCODONE-ACETAMINOPHEN 5-325 MG PO TABS
1.0000 | ORAL_TABLET | Freq: Four times a day (QID) | ORAL | Status: DC | PRN
  Administered 2019-08-08 – 2019-08-09 (×4): 1 via ORAL
  Administered 2019-08-09: 2 via ORAL
  Administered 2019-08-10 – 2019-08-15 (×4): 1 via ORAL
  Filled 2019-08-08: qty 1
  Filled 2019-08-08: qty 2
  Filled 2019-08-08: qty 1
  Filled 2019-08-08: qty 2
  Filled 2019-08-08 (×5): qty 1
  Filled 2019-08-08: qty 2

## 2019-08-08 MED ORDER — BISACODYL 5 MG PO TBEC
10.0000 mg | DELAYED_RELEASE_TABLET | Freq: Once | ORAL | Status: AC
  Administered 2019-08-08: 22:00:00 10 mg via ORAL
  Filled 2019-08-08: qty 2

## 2019-08-08 NOTE — TOC Progression Note (Addendum)
Transition of Care [facility]) - Progression Note    Patient Details  Name: Seth Mcpherson MRN: [id number] Date of Birth: 02/11/1946  Transition of Care [facility]) CM/SW [place], RN Phone Number: 08/08/2019, 1:56 PM  Clinical Narrative:      Spoke with patient's wife [name], she is currently trying to drop [employer] and go straight Medicare,  They have chosen [facility] for short term rehab.  Wife is asking for a Community Palliative Consult for patient at discharge.   Spoke with Tammy at [facility] and she is starting [place] with [employer].   Palliative Community referral sent to [employer].   Expected Discharge Plan: (SNF vs. CIR) Barriers to Discharge: Continued Medical Work up  Expected Discharge Plan and Services Expected Discharge Plan: (SNF vs. CIR)     Post Acute Care Choice: (SNF vs. CIR) Living arrangements for the past 2 months: Single Family Home                                       Social Determinants of Health (SDOH) Interventions    Readmission Risk Interventions No flowsheet data found.

## 2019-08-08 NOTE — Care Management Important Message (Signed)
Important Message  Patient Details  Name: Seth Mcpherson MRN: [id number] Date of Birth: Oct 19, 1945   Medicare Important Message Given:  Yes     Dannette Barbara 08/08/2019, 12:34 PM

## 2019-08-08 NOTE — Progress Notes (Signed)
Physical Therapy Treatment Patient Details Name: Seth Mcpherson MRN: [id number] DOB: 07/09/1945 Today's Date: 08/08/2019    History of Present Illness Per MD notes: Pt is a 74 y.o. male with a history of diabetes, hypertension, Parkinson's, dementia, irritable bowel syndrome, and prostate cancer.  The patient apparently had been having difficulty with a flareup of his irritable bowel syndrome symptoms over the past few months, resulting in increased episodes of diarrhea.  Apparently he went to get up from his bed when he became lightheaded and passed out, falling onto his right hip.  A CT scan of his pelvis was obtained which demonstrated a comminuted mildly displaced acetabular fracture.  Pt to be managed conservatively.    PT Comments    Pre-medicated before session.  Participated in exercises as described below.  To EOB with mod a x 1 to initiate transition but required max a x 2 to get fully upright.  Once sitting, he is able to sit for 10 minutes with overall improved balance.  Attempted lateral scooting to right but he required max a x 2/dependant with HOB lowered to assist.  Continues to need verbal and tactile cues to remain upright with occasional mod a x 1 due to fatigue.  Returned to supine with max a x 2.  Overall improved tolerance but remains limited in mobility.   Follow Up Recommendations  SNF     Equipment Recommendations  None recommended by PT    Recommendations for Other Services       Precautions / Restrictions Precautions Precautions: Fall Restrictions Weight Bearing Restrictions: Yes RLE Weight Bearing: Non weight bearing    Mobility  Bed Mobility Overal bed mobility: Needs Assistance Bed Mobility: Supine to Sit;Sit to Supine     Supine to sit: Max assist;+2 for physical assistance Sit to supine: Max assist;+2 for physical assistance      Transfers Overall transfer level: Needs assistance Equipment used: None Transfers: Lateral/Scoot Transfers           Lateral/Scoot Transfers: Max assist;+2 physical assistance;Total assist General transfer comment: poor ability to assist with lateral scooting.  Not ready for sliding board transfers.  Ambulation/Gait             General Gait Details: unable   Stairs             Wheelchair Mobility    Modified Rankin (Stroke Patients Only)       Balance Overall balance assessment: Needs assistance Sitting-balance support: Bilateral upper extremity supported;Feet supported Sitting balance-Leahy Scale: Poor Sitting balance - Comments: improved but continues to need verbal and tactile cues for balance.  mod a at times but less freqently than yesterday.                                    Cognition Arousal/Alertness: Awake/alert Behavior During Therapy: WFL for tasks assessed/performed Overall Cognitive Status: Within Functional Limits for tasks assessed                                        Exercises Other Exercises Other Exercises: RLE supine AAROM - heel slides, ab/add and SLR, seated LAQ BLE    General Comments        Pertinent Vitals/Pain Pain Assessment: Faces Faces Pain Scale: Hurts a little bit Pain Location: generally comfortable today Pain Descriptors / Indicators:  Sore Pain Intervention(s): Limited activity within patient's tolerance;Monitored during session;Premedicated before session    Home Living                      Prior Function            PT Goals (current goals can now be found in the care plan section) Progress towards PT goals: Progressing toward goals    Frequency    7X/week      PT Plan Current plan remains appropriate    Co-evaluation              AM-PAC PT "6 Clicks" Mobility   Outcome Measure  Help needed turning from your back to your side while in a flat bed without using bedrails?: Total Help needed moving from lying on your back to sitting on the side of a flat bed without  using bedrails?: Total Help needed moving to and from a bed to a chair (including a wheelchair)?: Total Help needed standing up from a chair using your arms (e.g., wheelchair or bedside chair)?: Total Help needed to walk in hospital room?: Total Help needed climbing 3-5 steps with a railing? : Total 6 Click Score: 6    End of Session   Activity Tolerance: Patient tolerated treatment well Patient left: in bed;with call bell/phone within reach;with bed alarm set;with family/visitor present Nurse Communication: Mobility status Pain - Right/Left: Right Pain - part of body: Hip     Time: [id number] PT Time Calculation (min) (ACUTE ONLY): 24 min  Charges:  $Therapeutic Exercise: 8-22 mins $Therapeutic Activity: 8-22 mins                    Chesley Noon, PTA 08/08/19, 12:51 PM

## 2019-08-08 NOTE — Progress Notes (Signed)
Pt on low bed. Hoyer lift was not avaible, unable to weigh pt. Will continue to monitor.

## 2019-08-08 NOTE — Progress Notes (Signed)
[facility] Room [place] note   Received referral for outpatient Palliative Care services to be followed by [facility] at discharge.  Will follow for disposition.  Please call with any questions or concerns.  Thank you, Margaretmary Eddy, BSN, RN [facility] Liaison [phone]

## 2019-08-08 NOTE — Plan of Care (Signed)
  Problem: Pain Managment: Goal: General experience of comfort will improve Outcome: Progressing   Problem: Safety: Goal: Ability to remain free from injury will improve Outcome: Progressing   

## 2019-08-08 NOTE — Progress Notes (Signed)
Subjective: The patient appears comfortable and cooperative this morning.  He states that his hip pain is somewhat improved.   Objective: Vital signs in last 24 hours: Temp:  [98.1 F (36.7 C)-98.7 F (37.1 C)] 98.1 F (36.7 C) (05/03 1133) Pulse Rate:  [68-77] 74 (05/03 1133) Resp:  [16-20] 17 (05/03 1133) BP: (107-113)/(55-75) 108/67 (05/03 1133) SpO2:  [93 %-97 %] 95 % (05/03 1133)  Intake/Output from previous day: 05/02 0701 - 05/03 0700 In: 480 [P.O.:480] Out: 500 [Urine:500] Intake/Output this shift: Total I/O In: 480 [P.O.:480] Out: -   No results for input(s): HGB in the last 72 hours. No results for input(s): WBC, RBC, HCT, PLT in the last 72 hours. Recent Labs    08/06/19 0856  NA 135  K 4.0  CL 101  CO2 27  BUN 20  CREATININE 0.75  GLUCOSE 123*  CALCIUM 8.1*   No results for input(s): LABPT, INR in the last 72 hours.  Physical Exam: Skin inspection around the right hip is unremarkable.  No swelling, erythema, ecchymosis, abrasions, or other skin abnormalities are identified.  He has only mild tenderness to palpation over the anterior lateral aspects of the hip.  He can tolerate gentle logrolling of the leg without too much pain.  However, he continued to have pain when he attempts to actively try to move his leg.  He is neurovascularly intact to the right lower extremity and foot.  Assessment: Mildly displaced right acetabular fracture.  Plan: The plan will be to continue nonsurgical management of his right acetabular fracture to include progressive mobilization with physical therapy and appropriate pain medication as deemed medically necessary.   Marshall Cork Ariday Brinker 08/08/2019, 12:37 PM

## 2019-08-08 NOTE — Progress Notes (Signed)
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  Found to have complex acetabular fracture, orthopedics at [facility] felt patient needed higher level of care.  No beds available at [place] or [facility] at that time, placed on [facility] waitlist.  Patient admitted for pain control until transfer to [facility] accomplished.  A & P  Closed right acetabular fracture, complex --Continue physical therapy.  Continue pain control. --Although toe-touch weightbearing is technically allowed, the patient is not able to perform this secondary to multiple factors.  Therefore orthopedics has recommended no weightbearing right lower extremity. --Repeat radiographs in 2 to 3 days and follow-up with orthopedics as an outpatient.  Diabetes mellitus type 2 --CBG remains stable.  Continue sliding scale insulin.  Resume metformin on discharge.  Parkinson's disease with associated dementia. --Remains stable.  Continue Sinemet, Exelon  Irritable bowel syndrome with chronic diarrhea --Continue supportive care  Syncope secondary to autonomic instability, possible orthostatic hypotension with mild dehydration, probably related to diarrhea. Telemetry unremarkable, high-sensitivity troponin negative, no EKG performed. --No further evaluation suggested  Dehydration --Resolved.  Disposition Plan:   Status is: Inpatient  Remains inpatient appropriate because: Pain management.  Awaiting SNF bed.  Dispo: The patient is from: Home              Anticipated d/c is to: SNF              Anticipated d/c date is: 5/3              Patient currently medically stable for discharge  DVT prophylaxis: enoxaparin Code Status: Full Family Communication: wife at bedside 5/3  Seth Hodgkins,  MD  [facility] Direct contact: see [email or web address] (further directions at bottom of note if needed) 7PM-7AM contact night coverage as at bottom of note 08/08/2019, 12:25 PM  LOS: 3 days    Consults:  . Orthopedics   Interval History/Subjective  Feeling better, less pain. Wife at bedside reports eating very well.  Very confused yesterday eventually fell asleep.  Objective   Vitals:  Vitals:   08/08/19 0742 08/08/19 1133  BP: 113/71 108/67  Pulse: 71 74  Resp: 16 17  Temp: 98.1 F (36.7 C) 98.1 F (36.7 C)  SpO2: 95% 95%    Exam:  Constitutional.  Appears calm and comfortable. Respiratory.  Clear to auscultation bilaterally.  No wheezes, rales or rhonchi.  Normal respiratory effort. Cardiovascular.  Regular rate and rhythm.  No murmur, rub or gallop.  No lower extremity edema. Right foot: Dorsalis pedis pulse 2+.  Perfusion appears intact.  Move spontaneously. Psychiatric.  Grossly normal mood and affect superficially.  Is chronically confused.  I have personally reviewed the following:   Today's Data  Urinalysis negative CBG stable  Scheduled Meds: . aspirin EC  81 mg Oral Daily  . atorvastatin  40 mg Oral Daily  . carbidopa-levodopa  0.5-1 tablet Oral QID  . enoxaparin (LOVENOX) injection  40 mg Subcutaneous Q24H  . folic acid  3 mg Oral Daily  . insulin aspart  0-9 Units Subcutaneous Q4H  . PARoxetine  40 mg Oral Daily  . rivastigmine  1.5 mg Oral BID  . tamsulosin  0.4 mg Oral Daily   Continuous Infusions:   Principal Problem:   Closed right acetabular fracture (HCC) Active Problems:  Chronic diarrhea   Type 2 diabetes mellitus without complication (HCC)   Essential hypertension   Parkinson disease (HCC)   Cognitive deficit due to Parkinson's disease ([place])   Syncope   Hypotension   AKI (acute kidney injury) ([place])   Acetabular fracture ([place])   LOS: 3 days   How to contact the [facility] Attending or Consulting provider 7A - 7P or covering provider  during after hours [place], for this patient?  1. Check the care team in [facility] and look for a) attending/consulting TRH provider listed and b) the [facility] team listed 2. Log into [email or web address] and use [place]'s universal password to access. If you do not have the password, please contact the hospital operator. 3. Locate the [facility] provider you are looking for under [facility] and page to a number that you can be directly reached. 4. If you still have difficulty reaching the provider, please page the [facility] (Director on Call) for the Hospitalists listed on amion for assistance.

## 2019-08-08 NOTE — TOC Progression Note (Signed)
Transition of Care [facility]) - Progression Note    Patient Details  Name: Seth Mcpherson MRN: [id number] Date of Birth: 13-Oct-1945  Transition of Care [facility]) CM/SW [place], RN Phone Number: 08/08/2019, 9:02 AM  Clinical Narrative:      Unable to complete PASSR at this time, Previous SW listed as [place], [place] is working on error and I will resume or submit again.    Expected Discharge Plan: (SNF vs. CIR) Barriers to Discharge: Continued Medical Work up  Expected Discharge Plan and Services Expected Discharge Plan: (SNF vs. CIR)     Post Acute Care Choice: (SNF vs. CIR) Living arrangements for the past 2 months: Single Family Home                                       Social Determinants of Health (SDOH) Interventions    Readmission Risk Interventions No flowsheet data found.

## 2019-08-09 LAB — GLUCOSE, CAPILLARY
Glucose-Capillary: 114 mg/dL — ABNORMAL HIGH (ref 70–99)
Glucose-Capillary: 118 mg/dL — ABNORMAL HIGH (ref 70–99)
Glucose-Capillary: 120 mg/dL — ABNORMAL HIGH (ref 70–99)
Glucose-Capillary: 121 mg/dL — ABNORMAL HIGH (ref 70–99)
Glucose-Capillary: 124 mg/dL — ABNORMAL HIGH (ref 70–99)
Glucose-Capillary: 140 mg/dL — ABNORMAL HIGH (ref 70–99)

## 2019-08-09 MED ORDER — CARBIDOPA-LEVODOPA 25-100 MG PO TABS
1.0000 | ORAL_TABLET | Freq: Four times a day (QID) | ORAL | Status: DC
  Administered 2019-08-09 – 2019-08-15 (×26): 1 via ORAL
  Filled 2019-08-09 (×28): qty 1

## 2019-08-09 MED FILL — Methotrexate Sodium Inj 50 MG/2ML (25 MG/ML): INTRAMUSCULAR | Qty: 0.8 | Status: AC

## 2019-08-09 NOTE — Plan of Care (Signed)
  Problem: Pain Managment: Goal: General experience of comfort will improve Outcome: Progressing   Problem: Safety: Goal: Ability to remain free from injury will improve Outcome: Progressing   

## 2019-08-09 NOTE — Progress Notes (Signed)
Subjective: No new complaints.  The patient appears to be comfortable in bed this morning.  According to his wife who is at the bedside, the patient seems to be "clearer" today than in the past several days.   Objective: Vital signs in last 24 hours: Temp:  [98 F (36.7 C)-99.3 F (37.4 C)] 99.2 F (37.3 C) (05/04 0745) Pulse Rate:  [70-89] 79 (05/04 1145) Resp:  [16-20] 18 (05/04 1145) BP: (106-111)/(61-71) 109/61 (05/04 1145) SpO2:  [93 %-95 %] 94 % (05/04 1145)  Intake/Output from previous day: 05/03 0701 - 05/04 0700 In: 720 [P.O.:720] Out: 175 [Urine:175] Intake/Output this shift: Total I/O In: -  Out: 800 [Urine:800]  No results for input(s): HGB in the last 72 hours. No results for input(s): WBC, RBC, HCT, PLT in the last 72 hours. No results for input(s): NA, K, CL, CO2, BUN, CREATININE, GLUCOSE, CALCIUM in the last 72 hours. No results for input(s): LABPT, INR in the last 72 hours.  Physical Exam: Skin inspection around the right hip again is unremarkable.  No swelling, erythema, ecchymosis, abrasions, or other skin abnormalities are identified.  There is essentially no tenderness to palpation over the anterior or lateral aspects of the hip.  He is able to tolerate gentle logrolling of the leg with minimal discomfort.  He still has more moderate pain when he actively tries to move his leg.  He remains neurovascularly intact to the right lower extremity and foot.  Assessment: Mildly displaced right acetabular fracture.  Plan: The treatment options are reviewed with the patient and his wife, who is at the bedside.  We will continue nonsurgical management of his right acetabular fracture to include progressive mobilization with physical therapy and appropriate pain medication as deemed medically necessary.   He will require skilled nursing care following his hospital stay.  Also, I would like to obtain a repeat pelvic x-ray prior to his discharge from the  hospital.   Marshall Cork Kaedance Magos 08/09/2019, 2:45 PM

## 2019-08-09 NOTE — Progress Notes (Signed)
Physical Therapy Treatment Patient Details Name: Seth Mcpherson MRN: [id number] DOB: 1945/08/14 Today's Date: 08/09/2019    History of Present Illness Per MD notes: Pt is a 74 y.o. male with a history of diabetes, hypertension, Parkinson's, dementia, irritable bowel syndrome, and prostate cancer.  The patient apparently had been having difficulty with a flareup of his irritable bowel syndrome symptoms over the past few months, resulting in increased episodes of diarrhea.  Apparently he went to get up from his bed when he became lightheaded and passed out, falling onto his right hip.  A CT scan of his pelvis was obtained which demonstrated a comminuted mildly displaced acetabular fracture.  Pt to be managed conservatively.    PT Comments    Pt was long sitting in bed upon arriving. He agrees to PT session and is cooperative throughout. Supportive spouse present throughout session. He reports pain is improved from previous date and rated 4/10. He required max assist to sit up and max assist to return to supine after performing slide board transfers in/out of recliner. Max assist to transfer via slide board to recliner with total assist to return to bed. He was able to maintain NWB with cueing. Pt does fatigue quickly with activity but overall tolerated well. He was supine in bed at conclusion of PT session with RN techs in room. Acute PT will continue to follow per POC and progress as able per pt tolerance. Recommending SNF at [place] to improve safe functional mobility prior to returning home with spouse.    Follow Up Recommendations  SNF     Equipment Recommendations  None recommended by PT    Recommendations for Other Services       Precautions / Restrictions Precautions Precautions: Fall Restrictions Weight Bearing Restrictions: Yes RLE Weight Bearing: Non weight bearing    Mobility  Bed Mobility Overal bed mobility: Needs Assistance Bed Mobility: Supine to Sit;Sit to Supine Rolling: Max  assist Sidelying to sit: Max assist Supine to sit: Max assist;HOB elevated Sit to supine: Max assist   General bed mobility comments: Pt was able to sup>sit with increased time and vcs for sequencing throughout. assist LE/trunk support to achieve EOB sit. Required max assist with returning to supine from EOB sitting.  Transfers Overall transfer level: Needs assistance Equipment used: Sliding board Transfers: Lateral/Scoot Transfers          Lateral/Scoot Transfers: Max assist;Total assist;With slide board General transfer comment: pt required max assist to exit bed to recliner but total to safely return to bed after sitting in recliner. Increased time to perform with vcs for safety and sequencing. pt was able to perform with maintaining NWB but does require cueing for reminders  Ambulation/Gait             General Gait Details: unsafe to progress at this time   Stairs             Wheelchair Mobility    Modified Rankin (Stroke Patients Only)       Balance Overall balance assessment: Needs assistance Sitting-balance support: Bilateral upper extremity supported;Feet supported Sitting balance-Leahy Scale: Fair Sitting balance - Comments: pt required min to CGA throughout sitting EOB for safety. He does have posterior LOB.                                    Cognition Arousal/Alertness: Awake/alert Behavior During Therapy: WFL for tasks assessed/performed Overall Cognitive Status: Within  Functional Limits for tasks assessed                                 General Comments: Pt was long sitting in bed upon arriving, He agrees to session and reports feeling much better this date versus previous date.       Exercises      General Comments        Pertinent Vitals/Pain Faces Pain Scale: Hurts a little bit Pain Location: generally comfortable today Pain Descriptors / Indicators: Sore    Home Living                       Prior Function            PT Goals (current goals can now be found in the care plan section) Acute Rehab PT Goals Patient Stated Goal: " I want to be able to walk and move again"    Frequency    7X/week      PT Plan Current plan remains appropriate    Co-evaluation              AM-PAC PT "6 Clicks" Mobility   Outcome Measure  Help needed turning from your back to your side while in a flat bed without using bedrails?: A Lot Help needed moving from lying on your back to sitting on the side of a flat bed without using bedrails?: Total Help needed moving to and from a bed to a chair (including a wheelchair)?: Total Help needed standing up from a chair using your arms (e.g., wheelchair or bedside chair)?: Total Help needed to walk in hospital room?: Total Help needed climbing 3-5 steps with a railing? : Total 6 Click Score: 7    End of Session Equipment Utilized During Treatment: Gait belt Activity Tolerance: Patient tolerated treatment well Patient left: in bed;with call bell/phone within reach;with bed alarm set;with family/visitor present Nurse Communication: Mobility status PT Visit Diagnosis: Muscle weakness (generalized) (M62.81) Pain - Right/Left: Right Pain - part of body: Hip     Time:  -     Charges:                        Julaine Fusi PTA 08/09/19, 4:40 PM

## 2019-08-09 NOTE — Progress Notes (Signed)
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  Found to have complex acetabular fracture, orthopedics at [facility] felt patient needed higher level of care.  No beds available at [place] or [facility] at that time, placed on [facility] waitlist.  Patient admitted for pain control until transfer to [facility] accomplished.  A & P  Closed right acetabular fracture, complex --Continue physical therapy.  Continue pain control. --Although toe-touch weightbearing is technically allowed, the patient is not able to perform this secondary to multiple factors.  Therefore orthopedics has recommended no weightbearing right lower extremity. --Repeat radiographs as per orthopedics. Dr. Roland Rack plans to followup in outpatient setting.  Diabetes mellitus type 2 --CBG remains stable.  Continue sliding scale insulin.  Resume metformin on discharge.  Parkinson's disease with associated dementia. --Remains stable.  Continue Sinemet, Exelon  Irritable bowel syndrome with chronic diarrhea --Continue supportive care  Syncope secondary to autonomic instability, possible orthostatic hypotension with mild dehydration, probably related to diarrhea. Telemetry unremarkable, high-sensitivity troponin negative, no EKG performed. --No further evaluation suggested  Dehydration --Resolved.  Disposition Plan:   Status is: Inpatient  Remains inpatient appropriate because: Pain management.  Awaiting SNF bed.  Dispo: The patient is from: Home              Anticipated d/c is to: SNF              Anticipated d/c date is: When bed available              Patient currently medically stable for discharge  DVT prophylaxis: enoxaparin Code Status: Full Family Communication: wife at bedside 5/4   Murray Hodgkins, MD  [facility] Direct contact: see [email or web address] (further directions at bottom of note if needed) 7PM-7AM contact night coverage as at bottom of note 08/09/2019, 4:24 PM  LOS: 4 days    Consults:  . Orthopedics   Interval History/Subjective  Reports feeling better.  Objective   Vitals:  Vitals:   08/09/19 1145 08/09/19 1539  BP: 109/61 (!) 102/58  Pulse: 79 74  Resp: 18 17  Temp:  98.9 F (37.2 C)  SpO2: 94% 96%    Exam:  Constitutional.  Appears calm, comfortable.   Psychiatric.  Speech is fluent and clear. Cardiovascular.  Regular rate and rhythm.  No murmur, rub or gallop.  No lower extremity edema. Respiratory.  Clear to auscultation bilaterally.  No wheezes, rales or rhonchi.  Normal respiratory effort. Right foot.  Skin appears unremarkable.  Dorsalis pedis pulse 2+.  I have personally reviewed the following:   Today's Data  CBG stable.  Scheduled Meds: . aspirin EC  81 mg Oral Daily  . atorvastatin  40 mg Oral Daily  . carbidopa-levodopa  1 tablet Oral QID  . enoxaparin (LOVENOX) injection  40 mg Subcutaneous Q24H  . folic acid  3 mg Oral Daily  . insulin aspart  0-9 Units Subcutaneous Q4H  . Methotrexate  0.8 mL Subcutaneous Weekly  . PARoxetine  40 mg Oral Daily  . rivastigmine  1.5 mg Oral BID  . senna-docusate  1 tablet Oral QHS  . tamsulosin  0.4 mg Oral Daily   Continuous Infusions:   Principal Problem:   Closed right acetabular fracture (HCC) Active Problems:   Chronic diarrhea   Type 2 diabetes  mellitus without complication (HCC)   Essential hypertension   Parkinson disease (HCC)   Cognitive deficit due to Parkinson's disease ([place])   Syncope   Hypotension   AKI (acute kidney injury) ([place])   Acetabular fracture ([place])   LOS: 4 days   How to contact the [facility] Attending or Consulting provider [place] or covering provider during after hours [place], for this patient?  1. Check the care team in [facility] and look for a)  attending/consulting TRH provider listed and b) the [facility] team listed 2. Log into [email or web address] and use [place]'s universal password to access. If you do not have the password, please contact the hospital operator. 3. Locate the [facility] provider you are looking for under [facility] and page to a number that you can be directly reached. 4. If you still have difficulty reaching the provider, please page the [facility] (Director on Call) for the Hospitalists listed on amion for assistance.

## 2019-08-09 NOTE — Progress Notes (Signed)
[place] received referral from [place] to help get pt.'s AD signed and notarized.  2C notary already contacted and present @ RN station when [facility] arrived; Museum/gallery curator helped secure two visitors as witnesses; pt. signed document in presence of witnesses --> document notarized.  AD copy placed in chart and brought to medical records for immediate scanning into [facility].  Wife [name] designated as MPOA; marked as HCA in EPIC per AD.  Pt. and wife chose not to complete Living Will (voided in AD). [place] talked briefly w/pt. 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  CH remains available as needed.

## 2019-08-09 NOTE — ACP (Advance Care Planning) (Signed)
AD copy placed in chart and brought to medical records for immediate scanning into [employer]ynca.  Wife [name] designated as MPOA; marked as HCA in EPIC per AD.  Pt. and wife chose not to complete Living Will (voided in AD).

## 2019-08-09 NOTE — TOC Progression Note (Signed)
Transition of Care [facility]) - Progression Note    Patient Details  Name: Seth Mcpherson MRN: [id number] Date of Birth: March 22, 1946  Transition of Care [facility]) CM/SW [place], RN Phone Number: 08/09/2019, 10:18 AM  Clinical Narrative:     Received PASSR number [id number] E. Information provided to [facility].   Expected Discharge Plan: (SNF vs. CIR) Barriers to Discharge: Continued Medical Work up  Expected Discharge Plan and Services Expected Discharge Plan: (SNF vs. CIR)     Post Acute Care Choice: (SNF vs. CIR) Living arrangements for the past 2 months: Single Family Home                                       Social Determinants of Health (SDOH) Interventions    Readmission Risk Interventions No flowsheet data found.

## 2019-08-10 ENCOUNTER — Inpatient Hospital Stay: Payer: Medicare HMO

## 2019-08-10 DIAGNOSIS — I951 Orthostatic hypotension: Secondary | ICD-10-CM

## 2019-08-10 DIAGNOSIS — S32401A Unspecified fracture of right acetabulum, initial encounter for closed fracture: Principal | ICD-10-CM

## 2019-08-10 DIAGNOSIS — R55 Syncope and collapse: Secondary | ICD-10-CM

## 2019-08-10 DIAGNOSIS — K5900 Constipation, unspecified: Secondary | ICD-10-CM

## 2019-08-10 DIAGNOSIS — S32401D Unspecified fracture of right acetabulum, subsequent encounter for fracture with routine healing: Secondary | ICD-10-CM

## 2019-08-10 DIAGNOSIS — N179 Acute kidney failure, unspecified: Secondary | ICD-10-CM

## 2019-08-10 DIAGNOSIS — K529 Noninfective gastroenteritis and colitis, unspecified: Secondary | ICD-10-CM

## 2019-08-10 DIAGNOSIS — G20 Parkinson's disease: Secondary | ICD-10-CM

## 2019-08-10 DIAGNOSIS — I10 Essential (primary) hypertension: Secondary | ICD-10-CM

## 2019-08-10 DIAGNOSIS — E119 Type 2 diabetes mellitus without complications: Secondary | ICD-10-CM

## 2019-08-10 LAB — GLUCOSE, CAPILLARY
Glucose-Capillary: 100 mg/dL — ABNORMAL HIGH (ref 70–99)
Glucose-Capillary: 105 mg/dL — ABNORMAL HIGH (ref 70–99)
Glucose-Capillary: 110 mg/dL — ABNORMAL HIGH (ref 70–99)
Glucose-Capillary: 122 mg/dL — ABNORMAL HIGH (ref 70–99)
Glucose-Capillary: 127 mg/dL — ABNORMAL HIGH (ref 70–99)
Glucose-Capillary: 152 mg/dL — ABNORMAL HIGH (ref 70–99)

## 2019-08-10 NOTE — Progress Notes (Signed)
Subjective: No new complaints today.  The patient feels that his right hip pain is manageable at this time.   Objective: Vital signs in last 24 hours: Temp:  [98 F (36.7 C)-98.9 F (37.2 C)] 98 F (36.7 C) (05/05 0733) Pulse Rate:  [70-79] 78 (05/05 0733) Resp:  [16-19] 19 (05/05 0432) BP: (102-122)/(58-70) 115/66 (05/05 0733) SpO2:  [94 %-97 %] 95 % (05/05 0733)  Intake/Output from previous day: 05/04 0701 - 05/05 0700 In: 120 [P.O.:120] Out: 1300 [Urine:1300] Intake/Output this shift: Total I/O In: 360 [P.O.:360] Out: -   No results for input(s): HGB in the last 72 hours. No results for input(s): WBC, RBC, HCT, PLT in the last 72 hours. No results for input(s): NA, K, CL, CO2, BUN, CREATININE, GLUCOSE, CALCIUM in the last 72 hours. No results for input(s): LABPT, INR in the last 72 hours.  Physical Exam: Orthopedic examination is unchanged as compared to yesterday.  He remains neurovascularly intact to the right lower extremity and foot.  X-rays: AP and internal and external oblique views of the pelvis are obtained and have been reviewed by myself.  These films demonstrate overall satisfactory maintenance of the comminuted intra-articular acetabular fracture.  No new acute bony abnormalities are identified.  Assessment: Mildly displaced right acetabular fracture.  Plan: Nonsurgical treatment of his right acetabular fracture may be continued at this time.  He may continue to be mobilized with physical therapy so long as he remains nonweightbearing on the right lower extremity.  He may receive appropriate pain medication as indicated medically.  At this point, I will sign off on his care.  Thank you for asking me to participate in the care of this most delightful man and his wife.  I will be happy to see him in follow-up in my office in 1 month.   Marshall Cork Ardice Boyan 08/10/2019, 11:13 AM

## 2019-08-10 NOTE — Progress Notes (Signed)
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 Found to have complex acetabular fracture, orthopedics at [facility] felt patient needed higher level of care. No beds available at [place] or [facility] at that time, placed on [facility] waitlist. Patient admitted for pain control until transfer to [facility] accomplished. However, the patient has been evaluated by trauma surgery at [facility]. They feel that due to the patient's multiple comorbidities that he is not a good surgical candidate. The plan now is for the patient to discharge to [facility]. He is medically stable for discharge.  Consultants  . Orthopedic surgery  Procedures  . None  Antibiotics   Anti-infectives (From admission, onward)   None    .  Subjective  The patient is resting comfortably. His wife states that the patient has not had BM in 4-5 days. Enema ordered.  Objective   Vitals:  Vitals:   08/10/19 0733 08/10/19 1144  BP: 115/66 (!) 105/56  Pulse: 78 79  Resp:    Temp: 98 F (36.7 C) 98.3 F (36.8 C)  SpO2: 95% 94%   Exam:  Constitutional:  . The patient is awake, alert, and oriented x 3. No acute distress. Respiratory:  . No increased work of breathing. . No wheezes, rales, or rhonchi . No tactile fremitus Cardiovascular:  . Regular rate and rhythm . No murmurs, ectopy, or gallups. . No lateral PMI. No thrills. Abdomen:  . Abdomen is soft, non-tender, non-distended . No hernias, masses, or organomegaly . Normoactive bowel sounds.  Musculoskeletal:  . No cyanosis, clubbing, or edema Skin:  . No rashes, lesions, ulcers . palpation of skin: no induration or nodules Neurologic:  . CN 2-12 intact . Sensation all 4 extremities intact Psychiatric:  . Mental status o Mood,  affect appropriate o Orientation to person, place, time  . judgment and insight appear intact  I have personally reviewed the following:   Today's Data  . Vitals, glucoses  Imaging  . X-ray of the pelvis: Comminuted right acetabular fracture with associated fracture of the right inferior pubic ramus.   Scheduled Meds: . aspirin EC  81 mg Oral Daily  . atorvastatin  40 mg Oral Daily  . carbidopa-levodopa  1 tablet Oral QID  . enoxaparin (LOVENOX) injection  40 mg Subcutaneous Q24H  . folic acid  3 mg Oral Daily  . insulin aspart  0-9 Units Subcutaneous Q4H  . Methotrexate  0.8 mL Subcutaneous Weekly  . PARoxetine  40 mg Oral Daily  . rivastigmine  1.5 mg Oral BID  . senna-docusate  1 tablet Oral QHS  . tamsulosin  0.4 mg Oral Daily   Continuous Infusions:  Principal Problem:   Closed right acetabular fracture (HCC) Active Problems:   Chronic diarrhea   Type 2 diabetes mellitus without complication (HCC)   Essential hypertension   Parkinson disease (HCC)   Cognitive deficit due to Parkinson's disease ([place])   Syncope   Hypotension   AKI (acute kidney injury) ([place])   Acetabular fracture ([place])   LOS: 5 days   A & P   Closed right acetabular fracture, complex: Continue physical therapy and pain control. The patient is deemed a poor candidate for surgery. Plan is for the patient to discharge to SNF ([facility]). Although toe-touch weightbearing is technically allowed,  the patient is not able to perform this secondary to multiple factors.  Therefore orthopedics has recommended no weightbearing right lower extremity. Repeat radiographs of the pelvis as per orthopedics demonstrated. Comminuted right acetabular fracture with associated fracture of the right inferior pubic ramus. Dr. Roland Rack plans to followup in outpatient setting.  Diabetes mellitus type 2: CBG remains stable.  Glucoses have been 105 - 124 over the past 24 hours. Continue sliding scale insulin. Resume metformin  on discharge.  Parkinson's disease with associated dementia: Noted. Stable Continue sinemet and exelon.   Irritable bowel syndrome with chronic diarrhea: However, the patient is currently quite constipated. The patient's wife state that patient frequently get megacolon. Enema ordered.  Syncope secondary to autonomic instability, possible orthostatic hypotension with mild dehydration: This was probably related to diarrhea. Telemetry unremarkable, high-sensitivity troponin negative, no EKG performed. Will check orthostatic vitals.  Dehydration: Resolved.  I have seen and examined this patient myself. I have spent 34 minutes in his evaluation and care.  DVT prophylaxis: enoxaparin Code Status: Full Family Communication: wife at bedside 5/5 Disposition Plan: The patient is from home. Plan is for discharge to SNF. Barriers to discharge: severe constipation, pain control, awaiting SNF bed.   Mauricio Dahlen, DO [facility] Direct contact: see [email or web address]  7PM-7AM contact night coverage as above 08/10/2019, 1:05 PM  LOS: 5 days

## 2019-08-10 NOTE — TOC Progression Note (Signed)
Transition of Care (TOC) - Progression Note    Patient Details  Name: Seth Mcpherson MRN: [id number] Date of Birth: 08/01/1945  Transition of Care (TOC) CM/SW Contact   J , RN Phone Number: 08/10/2019, 12:49 PM  Clinical Narrative:     Met the patient and his wife in the room, She stated that she has called her case manager at [facility] and asked for help to get this expedited, It is still pending Avoidable days  Expected Discharge Plan: (SNF vs. CIR) Barriers to Discharge: Continued Medical Work up  Expected Discharge Plan and Services Expected Discharge Plan: (SNF vs. CIR)     Post Acute Care Choice: (SNF vs. CIR) Living arrangements for the past 2 months: Single Family Home                                       Social Determinants of Health (SDOH) Interventions    Readmission Risk Interventions No flowsheet data found.  

## 2019-08-10 NOTE — Progress Notes (Signed)
Physical Therapy Treatment Patient Details Name: Seth Mcpherson MRN: [id number] DOB: [birth year] Today's Date: 08/10/2019    History of Present Illness Per MD notes: Pt is a 74 y.o. male with a history of diabetes, hypertension, Parkinson's, dementia, irritable bowel syndrome, and prostate cancer.  The patient apparently had been having difficulty with a flareup of his irritable bowel syndrome symptoms over the past few months, resulting in increased episodes of diarrhea.  Apparently he went to get up from his bed when he became lightheaded and passed out, falling onto his right hip.  A CT scan of his pelvis was obtained which demonstrated a comminuted mildly displaced acetabular fracture.  Pt to be managed conservatively.    PT Comments    Pt was supine asleep in bed upon arriving with spouse present. Per spouse, pt is not having as good a day as previously observed. He awakes with cueing and does agree to PT session with encouragement. More tremors noted throughout session especially once he sat up EOB. He required increased assistance to maintain balance in sitting this date. He was able to tolerate performing there ex seated but falls asleep when attempting to perform in supine.  Per spouse, pt does sun down and prefers AM sessions versus later noon. PT will continue to progress as able per pt tolerance. Recommend DC to SNF to as pt with returning to PLOF.RN and spouse in room at conclusion of session with bed alarm in place and pt reports being comfortable.    Follow Up Recommendations  SNF     Equipment Recommendations  None recommended by PT    Recommendations for Other Services       Precautions / Restrictions Precautions Precautions: Fall Restrictions Weight Bearing Restrictions: Yes RLE Weight Bearing: Non weight bearing LLE Weight Bearing: Non weight bearing    Mobility  Bed Mobility Overal bed mobility: Needs Assistance Bed Mobility: Supine to Sit;Sit to Supine Rolling:  Max assist;+2 for safety/equipment   Supine to sit: Max assist;+2 for safety/equipment;HOB elevated Sit to supine: Max assist;+2 for safety/equipment;HOB elevated   General bed mobility comments: Pt was able to supine>< sit with increased time and max assist. pt required more time today and even more assistance. more tremors present upon sitting up. C/O increased pain in sitting.  Transfers                 General transfer comment: unsafe to trial this date  Ambulation/Gait                 Stairs             Wheelchair Mobility    Modified Rankin (Stroke Patients Only)       Balance Overall balance assessment: Needs assistance Sitting-balance support: Bilateral upper extremity supported;Feet supported Sitting balance-Leahy Scale: Fair Sitting balance - Comments: relys on UEs + BLes support to maintain balance EOB. more severe tremors in sitting this date                                    Cognition Arousal/Alertness: Lethargic Behavior During Therapy: Flat affect Overall Cognitive Status: History of cognitive impairments - at baseline                                 General Comments: Pt was lethargic throughout session but does agree to trial PT  session. requested not to get OOB this date      Exercises Total Joint Exercises Ankle Circles/Pumps: AROM;Both;20 reps Long Arc Quad: AROM;Both;Seated;10 reps General Exercises - Lower Extremity Quad Sets: AROM;5 reps    General Comments        Pertinent Vitals/Pain Pain Assessment: 0-10 Pain Score: 6  Faces Pain Scale: Hurts little more Pain Location: more pain this date versus previous.  Pain Descriptors / Indicators: Constant;Sore Pain Intervention(s): Limited activity within patient's tolerance;Monitored during session;Premedicated before session;Repositioned    Home Living                      Prior Function            PT Goals (current goals can  now be found in the care plan section) Acute Rehab PT Goals Patient Stated Goal: none stated Progress towards PT goals: Progressing toward goals    Frequency    7X/week      PT Plan Current plan remains appropriate    Co-evaluation              AM-PAC PT "6 Clicks" Mobility   Outcome Measure  Help needed turning from your back to your side while in a flat bed without using bedrails?: A Lot Help needed moving from lying on your back to sitting on the side of a flat bed without using bedrails?: Total Help needed moving to and from a bed to a chair (including a wheelchair)?: Total Help needed standing up from a chair using your arms (e.g., wheelchair or bedside chair)?: Total Help needed to walk in hospital room?: Total Help needed climbing 3-5 steps with a railing? : Total 6 Click Score: 7    End of Session Equipment Utilized During Treatment: Gait belt Activity Tolerance: Patient limited by lethargy Patient left: in bed;with call bell/phone within reach;with bed alarm set;with family/visitor present;with nursing/sitter in room Nurse Communication: Mobility status PT Visit Diagnosis: Muscle weakness (generalized) (M62.81) Pain - Right/Left: Right Pain - part of body: Hip     Time: 1350-1411 PT Time Calculation (min) (ACUTE ONLY): 21 min  Charges:  $Therapeutic Activity: 8-22 mins                     Julaine Fusi PTA 08/10/19, 3:09 PM

## 2019-08-10 NOTE — Progress Notes (Signed)
PT Cancellation Note  Patient Details Name: Seth Mcpherson MRN: [id number] DOB: 1945/09/07   Cancelled Treatment:     PT attempt. Pt c/o dizziness and increased fatigue/tremors this date. Pt requested therapist return at later time/date.    Willette Pa 08/10/2019, 11:15 AM

## 2019-08-11 LAB — GLUCOSE, CAPILLARY
Glucose-Capillary: 98 mg/dL (ref 70–99)
Glucose-Capillary: 102 mg/dL — ABNORMAL HIGH (ref 70–99)
Glucose-Capillary: 131 mg/dL — ABNORMAL HIGH (ref 70–99)
Glucose-Capillary: 101 mg/dL — ABNORMAL HIGH (ref 70–99)
Glucose-Capillary: 137 mg/dL — ABNORMAL HIGH (ref 70–99)

## 2019-08-11 NOTE — TOC Progression Note (Signed)
Transition of Care [facility]) - Progression Note    Patient Details  Name: Sidney Kosak MRN: [id number] Date of Birth: [birth year]  Transition of Care [facility]) CM/SW [place], RN Phone Number: 08/11/2019, 10:18 AM  Clinical Narrative:    Tammy with [facility] notified me that [facility] requested new clinical, she is going to fax the information requested, still pending   Expected Discharge Plan: (SNF vs. CIR) Barriers to Discharge: Continued Medical Work up  Expected Discharge Plan and Services Expected Discharge Plan: (SNF vs. CIR)     Post Acute Care Choice: (SNF vs. CIR) Living arrangements for the past 2 months: Single Family Home                                       Social Determinants of Health (SDOH) Interventions    Readmission Risk Interventions No flowsheet data found.

## 2019-08-11 NOTE — Progress Notes (Signed)
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 Found to have complex acetabular fracture, orthopedics at [facility] felt patient needed higher level of care. No beds available at [place] or [facility] at that time, placed on [facility] waitlist. Patient admitted for pain control until transfer to [facility] accomplished. However, the patient has been evaluated by trauma surgery at [facility]. They feel that due to the patient's multiple comorbidities that he is not a good surgical candidate. The plan now is for the patient to discharge to [facility]. He is medically stable for discharge.  Consultants  . Orthopedic surgery  Procedures  . None  Antibiotics   Anti-infectives (From admission, onward)   None     Subjective  The patient is resting comfortably. His wife tells me that he had a rough night last night, but he was able to recognize her this morning.  Objective   Vitals:  Vitals:   08/11/19 0751 08/11/19 1235  BP: 121/79 (!) 110/51  Pulse: 68 84  Resp:    Temp: 97.8 F (36.6 C) 98.1 F (36.7 C)  SpO2:  96%   Exam:  Constitutional:  . The patient is sleeping quietly. He is not awakened. No acute distress. Respiratory:  . No increased work of breathing. . No wheezes, rales, or rhonchi . No tactile fremitus Cardiovascular:  . Regular rate and rhythm . No murmurs, ectopy, or gallups. . No lateral PMI. No thrills. Abdomen:  . Abdomen is soft, non-tender, non-distended . No hernias, masses, or organomegaly . Normoactive bowel sounds.  Musculoskeletal:  . No cyanosis, clubbing, or edema Skin:  . No rashes, lesions, ulcers . palpation of skin: no induration or nodules Neurologic:  . CN 2-12 intact . Sensation all 4 extremities  intact Psychiatric:  . Mental status o Mood, affect appropriate o Orientation to person, place, time  . judgment and insight appear intact  I have personally reviewed the following:   Today's Data  . Vitals, glucoses  Imaging  . X-ray of the pelvis: Comminuted right acetabular fracture with associated fracture of the right inferior pubic ramus.   Scheduled Meds: . aspirin EC  81 mg Oral Daily  . atorvastatin  40 mg Oral Daily  . carbidopa-levodopa  1 tablet Oral QID  . enoxaparin (LOVENOX) injection  40 mg Subcutaneous Q24H  . folic acid  3 mg Oral Daily  . insulin aspart  0-9 Units Subcutaneous Q4H  . Methotrexate  0.8 mL Subcutaneous Weekly  . PARoxetine  40 mg Oral Daily  . rivastigmine  1.5 mg Oral BID  . senna-docusate  1 tablet Oral QHS  . tamsulosin  0.4 mg Oral Daily   Continuous Infusions:  Principal Problem:   Closed right acetabular fracture (HCC) Active Problems:   Chronic diarrhea   Type 2 diabetes mellitus without complication (HCC)   Essential hypertension   Parkinson disease (HCC)   Cognitive deficit due to Parkinson's disease ([name])   Syncope   Hypotension   AKI (acute kidney injury) ([place])   Acetabular fracture ([place])   LOS: 6 days   A & P   Closed right acetabular fracture, complex: Continue physical therapy and pain control. The patient is deemed a poor candidate for surgery. Plan is for the patient to discharge to SNF ([facility]). Although  toe-touch weightbearing is technically allowed, the patient is not able to perform this secondary to multiple factors.  Therefore orthopedics has recommended no weightbearing right lower extremity. Repeat radiographs of the pelvis as per orthopedics demonstrated. Comminuted right acetabular fracture with associated fracture of the right inferior pubic ramus. Dr. Roland Rack plans to followup in outpatient setting. The patient is awaiting SNF placement.  Diabetes mellitus type 2: CBG remains stable.  Glucoses have  been 105 - 124 over the past 24 hours. Continue sliding scale insulin. Resume metformin on discharge.  Parkinson's disease with associated dementia: Noted. Stable Continue sinemet and exelon.   Irritable bowel syndrome with chronic diarrhea: However, the patient is currently quite constipated. The patient's wife state that patient frequently get megacolon. The patient has had 2-3 "moderate" BM's in the last 24 hours..  Syncope secondary to autonomic instability, possible orthostatic hypotension with mild dehydration: This was probably related to diarrhea. Telemetry unremarkable, high-sensitivity troponin negative, no EKG performed.   Dehydration: Resolved.  I have seen and examined this patient myself. I have spent 30 minutes in his evaluation and care.  DVT prophylaxis: enoxaparin Code Status: Full Family Communication: wife at bedside 5/6 Disposition Plan: The patient is from home. Plan is for discharge to SNF. Barriers to discharge:  awaiting SNF bed. The patient is medically cleared for discharge.  Dejanay Wamboldt, DO [facility] Direct contact: see [email or web address]  7PM-7AM contact night coverage as above 08/11/2019, 3:41 PM  LOS: 5 days

## 2019-08-11 NOTE — Progress Notes (Signed)
Physical Therapy Treatment Patient Details Name: Seth Mcpherson MRN: [id number] DOB: Sep 01, 1945 Today's Date: 08/11/2019    History of Present Illness Per MD notes: Pt is a 74 y.o. male with a history of diabetes, hypertension, Parkinson's, dementia, irritable bowel syndrome, and prostate cancer.  The patient apparently had been having difficulty with a flareup of his irritable bowel syndrome symptoms over the past few months, resulting in increased episodes of diarrhea.  Apparently he went to get up from his bed when he became lightheaded and passed out, falling onto his right hip.  A CT scan of his pelvis was obtained which demonstrated a comminuted mildly displaced acetabular fracture.  Pt to be managed conservatively.    PT Comments    Pt was in semi-fowlers position upon arriving. He was finishing breakfast and agrees to PT session. Reports soreness but does not endorse pain. Pt much more alert this date but continues to present with cognition deficits that requires increased time to process. Pt was able to tolerated there ex on BLEs prior to exiting L side of bed with mod-max assist. He stood 2 x EOB to RW while maintaining proper NWB. Stood ~ 30 sec both trials. Poor eccentric controled lowering with stand to sit.Pt does fatigue quickly but overall tolerated session well. Therapist continues to recommend DC to SNF when stable to address deficits and assist pt to returning to PLOF. Acute PT will continue to follow per current POC. Pt was in bed with bed alarm in place, call bell in reach, and spouse at bedside.     Follow Up Recommendations  SNF     Equipment Recommendations  None recommended by PT    Recommendations for Other Services       Precautions / Restrictions Precautions Precautions: Fall Restrictions Weight Bearing Restrictions: Yes RLE Weight Bearing: Non weight bearing    Mobility  Bed Mobility Overal bed mobility: Needs Assistance Bed Mobility: Supine to Sit;Sit to  Supine     Supine to sit: HOB elevated;Mod assist;Max assist Sit to supine: Max assist   General bed mobility comments: Pt was able to progress BLEs to EOB but required mod-max for UE support and advancement with HHA. He required max assist to return to supine with BLEs and trunk support. pt very fatigue from OOB activity.  Transfers Overall transfer level: Needs assistance Equipment used: Rolling walker (2 wheeled) Transfers: Sit to/from Stand Sit to Stand: Mod assist;Max assist;From elevated surface         General transfer comment: With pt's RLE resting on therapist foot, pt was able to stand 2 x EOB with maintaining NWB RLE. vcs for technique and sequencing but overall pt tolerated well. poor eccentric controlled lowering to sitting.  Ambulation/Gait             General Gait Details: unable/unsafe to progress   Chief Strategy Officer    Modified Rankin (Stroke Patients Only)       Balance Overall balance assessment: Needs assistance Sitting-balance support: Feet supported Sitting balance-Leahy Scale: Good Sitting balance - Comments: less assistance required in sitting this date   Standing balance support: Bilateral upper extremity supported;During functional activity Standing balance-Leahy Scale: Fair Standing balance comment: once in standing, pt able to mainrain static standing with BUE support  with CGA.  Cognition Arousal/Alertness: Awake/alert Behavior During Therapy: WFL for tasks assessed/performed Overall Cognitive Status: History of cognitive impairments - at baseline                                 General Comments: Pt much more alert this date and able to follow commands consistently throughout.Increased time to process but very motivated      Exercises General Exercises - Lower Extremity Ankle Circles/Pumps: AROM;20 reps Quad Sets: AROM;Right;10 reps Long Arc Quad:  AROM;10 reps;Seated Heel Slides: AROM;10 reps;Supine Straight Leg Raises: AAROM;Right;10 reps    General Comments        Pertinent Vitals/Pain Pain Assessment: (did not rate pain reports soreness ) Pain Score: 0-No pain Pain Location: RLE Pain Descriptors / Indicators: Constant;Sore Pain Intervention(s): Limited activity within patient's tolerance;Monitored during session;Premedicated before session;Repositioned    Home Living                      Prior Function            PT Goals (current goals can now be found in the care plan section) Acute Rehab PT Goals Patient Stated Goal: none stated Progress towards PT goals: Progressing toward goals    Frequency    7X/week      PT Plan Current plan remains appropriate    Co-evaluation              AM-PAC PT "6 Clicks" Mobility   Outcome Measure  Help needed turning from your back to your side while in a flat bed without using bedrails?: A Lot Help needed moving from lying on your back to sitting on the side of a flat bed without using bedrails?: A Lot Help needed moving to and from a bed to a chair (including a wheelchair)?: A Lot Help needed standing up from a chair using your arms (e.g., wheelchair or bedside chair)?: A Lot Help needed to walk in hospital room?: Total Help needed climbing 3-5 steps with a railing? : Total 6 Click Score: 10    End of Session Equipment Utilized During Treatment: Gait belt Activity Tolerance: Patient tolerated treatment well Patient left: in bed;with call bell/phone within reach;with bed alarm set;with family/visitor present;with nursing/sitter in room Nurse Communication: Mobility status PT Visit Diagnosis: Muscle weakness (generalized) (M62.81) Pain - Right/Left: Right Pain - part of body: Hip     Time: [id number] PT Time Calculation (min) (ACUTE ONLY): 28 min  Charges:  $Therapeutic Exercise: 8-22 mins $Therapeutic Activity: 8-22 mins                      Julaine Fusi PTA 08/11/19, 9:42 AM

## 2019-08-11 NOTE — Plan of Care (Signed)

## 2019-08-11 NOTE — TOC Progression Note (Signed)
Transition of Care [facility]) - Progression Note    Patient Details  Name: Seth Mcpherson MRN: [id number] Date of Birth: 12/30/1945  Transition of Care [facility]) CM/SW [place], RN Phone Number: 08/11/2019, 9:42 AM  Clinical Narrative:    Reached out to Tammy with [facility] to inquire about insurance, awaiting an answer   Expected Discharge Plan: (SNF vs. CIR) Barriers to Discharge: Continued Medical Work up  Expected Discharge Plan and Services Expected Discharge Plan: (SNF vs. CIR)     Post Acute Care Choice: (SNF vs. CIR) Living arrangements for the past 2 months: Single Family Home                                       Social Determinants of Health (SDOH) Interventions    Readmission Risk Interventions No flowsheet data found.

## 2019-08-11 NOTE — Care Management Important Message (Signed)
Important Message  Patient Details  Name: Elijiah Altomari MRN: [id number] Date of Birth: 1946-02-14   Medicare Important Message Given:  Yes     [name] A Denaly Gatling 08/11/2019, 10:20 AM

## 2019-08-12 LAB — GLUCOSE, CAPILLARY
Glucose-Capillary: 101 mg/dL — ABNORMAL HIGH (ref 70–99)
Glucose-Capillary: 105 mg/dL — ABNORMAL HIGH (ref 70–99)
Glucose-Capillary: 107 mg/dL — ABNORMAL HIGH (ref 70–99)
Glucose-Capillary: 122 mg/dL — ABNORMAL HIGH (ref 70–99)
Glucose-Capillary: 126 mg/dL — ABNORMAL HIGH (ref 70–99)
Glucose-Capillary: 96 mg/dL (ref 70–99)
Glucose-Capillary: 98 mg/dL (ref 70–99)

## 2019-08-12 LAB — CREATININE, SERUM
Creatinine, Ser: 0.78 mg/dL (ref 0.61–1.24)
GFR calc Af Amer: >60 mL/min (ref >60)
GFR calc non Af Amer: >60 mL/min (ref >60)

## 2019-08-12 MED ORDER — HYDROCORTISONE 0.5 % EX CREA
TOPICAL_CREAM | Freq: Three times a day (TID) | CUTANEOUS | Status: DC
  Administered 2019-08-12: 1 via TOPICAL
  Filled 2019-08-12 (×2): qty 28.35

## 2019-08-12 NOTE — Progress Notes (Signed)
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 Found to have complex acetabular fracture, orthopedics at [facility] felt patient needed higher level of care. No beds available at [place] or [facility] at that time, placed on [facility] waitlist. Patient admitted for pain control until transfer to [facility] accomplished. However, the patient has been evaluated by trauma surgery at [facility]. They feel that due to the patient's multiple comorbidities that he is not a good surgical candidate. The plan now is for the patient to discharge to [facility]. He is medically stable for discharge.  Consultants  . Orthopedic surgery  Procedures  . None  Antibiotics   Anti-infectives (From admission, onward)   None     Subjective  The patient is resting comfortably. He is more alert and communicative than he has been in recent days.  His wife informs me of a rash on his back. He has had 2 BM's in the last day.   Objective   Vitals:  Vitals:   08/12/19 0739 08/12/19 1157  BP: 115/64 115/64  Pulse: 67 69  Resp: 20 17  Temp: 98.2 F (36.8 C) 98.4 F (36.9 C)  SpO2: 98% 96%   Exam:  Constitutional:  . The patient is awake and alert. No acute distress. Respiratory:  . No increased work of breathing. . No wheezes, rales, or rhonchi . No tactile fremitus Cardiovascular:  . Regular rate and rhythm . No murmurs, ectopy, or gallups. . No lateral PMI. No thrills. Abdomen:  . Abdomen is soft, non-tender, non-distended . No hernias, masses, or organomegaly . Normoactive bowel sounds.  Musculoskeletal:  . No cyanosis, clubbing, or edema Skin:  . There is a erythematous papular rash on his back that is puritic. . palpation of skin: no induration or nodules Neurologic:    . CN 2-12 intact . Sensation all 4 extremities intact Psychiatric:  . Mental status o Mood, affect appropriate o Orientation to person, place, time  . judgment and insight appear intact  I have personally reviewed the following:   Today's Data  . Vitals, glucoses  Imaging  . X-ray of the pelvis: Comminuted right acetabular fracture with associated fracture of the right inferior pubic ramus.   Scheduled Meds: . aspirin EC  81 mg Oral Daily  . atorvastatin  40 mg Oral Daily  . carbidopa-levodopa  1 tablet Oral QID  . enoxaparin (LOVENOX) injection  40 mg Subcutaneous Q24H  . folic acid  3 mg Oral Daily  . hydrocortisone cream   Topical TID  . insulin aspart  0-9 Units Subcutaneous Q4H  . Methotrexate  0.8 mL Subcutaneous Weekly  . PARoxetine  40 mg Oral Daily  . rivastigmine  1.5 mg Oral BID  . senna-docusate  1 tablet Oral QHS  . tamsulosin  0.4 mg Oral Daily   Continuous Infusions:  Principal Problem:   Closed right acetabular fracture (HCC) Active Problems:   Chronic diarrhea   Type 2 diabetes mellitus without complication (HCC)   Essential hypertension   Parkinson disease (HCC)   Cognitive deficit due to Parkinson's disease ([place])   Syncope   Hypotension   AKI (acute kidney injury) ([place])   Acetabular fracture ([place])   LOS: 7 days   A & P   Closed right acetabular fracture, complex: Continue  physical therapy and pain control. The patient is deemed a poor candidate for surgery. Plan is for the patient to discharge to SNF ([facility]). Although toe-touch weightbearing is technically allowed, the patient is not able to perform this secondary to multiple factors.  Therefore orthopedics has recommended no weightbearing right lower extremity. Repeat radiographs of the pelvis as per orthopedics demonstrated. Comminuted right acetabular fracture with associated fracture of the right inferior pubic ramus. Seth Mcpherson plans to followup in outpatient setting. The patient is  awaiting SNF placement.  Diabetes mellitus type 2: CBG remains stable.  Glucoses have been 105 - 124 over the past 24 hours. Continue sliding scale insulin. Resume metformin on discharge.  Rash: Likely contact dermatitis. I have ordered a low doses topical steroid.  Parkinson's disease with associated dementia: Noted. Stable Continue sinemet and exelon.   Irritable bowel syndrome with chronic diarrhea: However, the patient is currently quite constipated. The patient's wife state that patient frequently get megacolon. The patient has had 2-3 "moderate" BM's in the last 24 hours..  Syncope secondary to autonomic instability, possible orthostatic hypotension with mild dehydration: This was probably related to diarrhea. Telemetry unremarkable, high-sensitivity troponin negative, no EKG performed.   Dehydration: Resolved.  I have seen and examined this patient myself. I have spent 30 minutes in his evaluation and care.  DVT prophylaxis: enoxaparin Code Status: Full Family Communication: wife at bedside 5/7 Disposition Plan: The patient is from home. Plan is for discharge to SNF. Barriers to discharge:  awaiting SNF bed. The patient is medically cleared for discharge.  Seth Zalar, DO [facility] Direct contact: see [email or web address]  7PM-7AM contact night coverage as above 08/12/2019, 3:12 PM  LOS: 5 days

## 2019-08-12 NOTE — Progress Notes (Signed)
Physical Therapy Treatment Patient Details Name: Seth Mcpherson MRN: [id number] DOB: 07/10/1945 Today's Date: 08/12/2019    History of Present Illness Per MD notes: Pt is a 74 y.o. male with a history of diabetes, hypertension, Parkinson's, dementia, irritable bowel syndrome, and prostate cancer.  The patient apparently had been having difficulty with a flareup of his irritable bowel syndrome symptoms over the past few months, resulting in increased episodes of diarrhea.  Apparently he went to get up from his bed when he became lightheaded and passed out, falling onto his right hip.  A CT scan of his pelvis was obtained which demonstrated a comminuted mildly displaced acetabular fracture.  Pt to be managed conservatively.    PT Comments    Participated in exercises as described below.  To EOB with increased time and mod a x 1 but good effort given.  Once sitting generally steady with supervision and occasional min guard/assist with ex due to post LOB.  He is able to stand x 2 with RW at bedside with foot on my foot (R) to monitor WB status.  Standing time is increased and he is able to shuffle L foot and move left and right along bed today with min a x 2.  After activity stated he needed to have a BM.  Stand pivot to commode with max a x 1 and min/mod a x 1 due to tight space and little room to safely move walker.  - for BM but bed was moved and he is able to transfer back with RW and mod a x 2 with increased assist likely due to fatigue and some hesitancy with transfer. Returned to supine with mod/max a x 2.   Awaiting insurance authorization for SNF.  If pt does not go today, will initiate transfer training to recliner at bedside tomorrow given his ability today to maintain NWB with activity.  Pt/wife was concerned about fatigue and possible transfer to SNF today.   Follow Up Recommendations  SNF     Equipment Recommendations  None recommended by PT    Recommendations for Other Services        Precautions / Restrictions Precautions Precautions: Fall Restrictions Weight Bearing Restrictions: Yes RLE Weight Bearing: Non weight bearing LLE Weight Bearing: Non weight bearing    Mobility  Bed Mobility Overal bed mobility: Needs Assistance Bed Mobility: Supine to Sit;Sit to Supine     Supine to sit: Mod assist;HOB elevated Sit to supine: Mod assist;Max assist;+2 for physical assistance      Transfers Overall transfer level: Needs assistance Equipment used: Rolling walker (2 wheeled) Transfers: Sit to/from [employer] Sit to Stand: Mod assist;Min assist;+2 physical assistance Stand pivot transfers: Mod assist;Max assist;+2 physical assistance       General transfer comment: does well with walker.  trial of stand pivot without RW to commode but pt fearful and does better with RW  Ambulation/Gait Ambulation/Gait assistance: Min assist;+2 physical assistance Gait Distance (Feet): 4 Feet Assistive device: Rolling walker (2 wheeled) Gait Pattern/deviations: Step-to pattern Gait velocity: decreased   General Gait Details: able to sidestep/shimmy left/right along bed and to commode and maintain NWB well.   Stairs             Wheelchair Mobility    Modified Rankin (Stroke Patients Only)       Balance Overall balance assessment: Needs assistance Sitting-balance support: Feet supported Sitting balance-Leahy Scale: Good     Standing balance support: Bilateral upper extremity supported;During functional activity Standing balance-Leahy  Scale: Fair Standing balance comment: once in standing, pt able to mainrain static standing with BUE support  with CGA.                            Cognition Arousal/Alertness: Awake/alert Behavior During Therapy: WFL for tasks assessed/performed Overall Cognitive Status: History of cognitive impairments - at baseline                                        Exercises Total Joint  Exercises Ankle Circles/Pumps: AROM;Both;10 reps Short Arc Quad: AROM;AAROM;Both;10 reps Heel Slides: AAROM;AROM;Both;10 reps Hip ABduction/ADduction: AAROM;AROM;Both;10 reps Long Arc Quad: AROM;Strengthening;Both;5 reps;10 reps Knee Flexion: AROM;Strengthening;Both;5 reps;10 reps    General Comments        Pertinent Vitals/Pain Pain Assessment: Faces Faces Pain Scale: Hurts a little bit Pain Location: RLE Pain Descriptors / Indicators: Constant;Sore Pain Intervention(s): Limited activity within patient's tolerance;Monitored during session    Home Living                      Prior Function            PT Goals (current goals can now be found in the care plan section) Progress towards PT goals: Progressing toward goals    Frequency    7X/week      PT Plan Current plan remains appropriate    Co-evaluation              AM-PAC PT "6 Clicks" Mobility   Outcome Measure  Help needed turning from your back to your side while in a flat bed without using bedrails?: A Lot Help needed moving from lying on your back to sitting on the side of a flat bed without using bedrails?: A Lot Help needed moving to and from a bed to a chair (including a wheelchair)?: A Lot Help needed standing up from a chair using your arms (e.g., wheelchair or bedside chair)?: A Lot Help needed to walk in hospital room?: A Lot Help needed climbing 3-5 steps with a railing? : Total 6 Click Score: 11    End of Session Equipment Utilized During Treatment: Gait belt Activity Tolerance: Patient tolerated treatment well Patient left: in bed;with call bell/phone within reach;with bed alarm set;with family/visitor present Nurse Communication: Mobility status Pain - Right/Left: Right Pain - part of body: Hip     Time: [id number] PT Time Calculation (min) (ACUTE ONLY): 32 min  Charges:  $Gait Training: 8-22 mins $Therapeutic Exercise: 8-22 mins                    Chesley Noon,  PTA 08/12/19, 9:55 AM

## 2019-08-13 LAB — GLUCOSE, CAPILLARY
Glucose-Capillary: 117 mg/dL — ABNORMAL HIGH (ref 70–99)
Glucose-Capillary: 111 mg/dL — ABNORMAL HIGH (ref 70–99)
Glucose-Capillary: 124 mg/dL — ABNORMAL HIGH (ref 70–99)
Glucose-Capillary: 147 mg/dL — ABNORMAL HIGH (ref 70–99)

## 2019-08-13 MED ORDER — DIPHENHYDRAMINE HCL 25 MG PO CAPS
25.0000 mg | ORAL_CAPSULE | Freq: Four times a day (QID) | ORAL | Status: DC | PRN
  Administered 2019-08-13 – 2019-08-14 (×2): 25 mg via ORAL
  Filled 2019-08-13 (×3): qty 1

## 2019-08-13 NOTE — Progress Notes (Signed)
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 Found to have complex acetabular fracture, orthopedics at [facility] felt patient needed higher level of care. No beds available at [place] or [facility] at that time, placed on [facility] waitlist. Patient admitted for pain control until transfer to [facility] accomplished. However, the patient has been evaluated by trauma surgery at [facility]. They feel that due to the patient's multiple comorbidities that he is not a good surgical candidate. The plan now is for the patient to discharge to [facility]. He is medically stable for discharge.  Consultants  . Orthopedic surgery  Procedures  . None  Antibiotics   Anti-infectives (From admission, onward)   None     Subjective  The patient is sitting up in a chair. No new complaints.  Objective   Vitals:  Vitals:   08/13/19 0449 08/13/19 0830  BP: 121/70 113/68  Pulse: 77 70  Resp: 17 18  Temp: 98.5 F (36.9 C) (!) 97.5 F (36.4 C)  SpO2: 93% 97%   Exam:  Constitutional:  . The patient is awake and alert. No acute distress. Respiratory:  . No increased work of breathing. . No wheezes, rales, or rhonchi . No tactile fremitus Cardiovascular:  . Regular rate and rhythm . No murmurs, ectopy, or gallups. . No lateral PMI. No thrills. Abdomen:  . Abdomen is soft, non-tender, non-distended . No hernias, masses, or organomegaly . Normoactive bowel sounds.  Musculoskeletal:  . No cyanosis, clubbing, or edema Skin:  . There is a erythematous papular rash on his back that is puritic. It is less erythematous today. . palpation of skin: no induration or nodules Neurologic:  . CN 2-12 intact . Sensation all 4 extremities intact Psychiatric:  . Mental  status o Mood, affect appropriate o Orientation to person, place, time  . judgment and insight appear intact  I have personally reviewed the following:   Today's Data  . Vitals, glucoses  Imaging  . X-ray of the pelvis: Comminuted right acetabular fracture with associated fracture of the right inferior pubic ramus.   Scheduled Meds: . aspirin EC  81 mg Oral Daily  . atorvastatin  40 mg Oral Daily  . carbidopa-levodopa  1 tablet Oral QID  . enoxaparin (LOVENOX) injection  40 mg Subcutaneous Q24H  . folic acid  3 mg Oral Daily  . hydrocortisone cream   Topical TID  . insulin aspart  0-9 Units Subcutaneous Q4H  . Methotrexate  0.8 mL Subcutaneous Weekly  . PARoxetine  40 mg Oral Daily  . rivastigmine  1.5 mg Oral BID  . senna-docusate  1 tablet Oral QHS  . tamsulosin  0.4 mg Oral Daily   Continuous Infusions:  Principal Problem:   Closed right acetabular fracture (HCC) Active Problems:   Chronic diarrhea   Type 2 diabetes mellitus without complication (HCC)   Essential hypertension   Parkinson disease (HCC)   Cognitive deficit due to Parkinson's disease ([place])   Syncope   Hypotension   AKI (acute kidney injury) ([place])   Acetabular fracture ([place])   LOS: 8 days   A & P   Closed right acetabular fracture, complex: Continue physical therapy and pain control. The patient is deemed a poor candidate for surgery. Plan is for the patient to discharge to SNF (  [facility]). Although toe-touch weightbearing is technically allowed, the patient is not able to perform this secondary to multiple factors.  Therefore orthopedics has recommended no weightbearing right lower extremity. Repeat radiographs of the pelvis as per orthopedics demonstrated. Comminuted right acetabular fracture with associated fracture of the right inferior pubic ramus. Dr. Roland Rack plans to followup in outpatient setting. The patient is awaiting SNF placement.  Diabetes mellitus type 2: CBG remains stable.   Glucoses have been 98 - 126 over the past 24 hours. Continue sliding scale insulin. Resume metformin on discharge.  Rash: Likely contact dermatitis. I have ordered a low doses topical steroid.  Parkinson's disease with associated dementia: Noted. Stable Continue sinemet and exelon.   Irritable bowel syndrome with chronic diarrhea: However, the patient is currently quite constipated. The patient's wife state that patient frequently get megacolon. The patient has had 2-3 "moderate" BM's in the last 24 hours.  Syncope secondary to autonomic instability, possible orthostatic hypotension with mild dehydration: This was probably related to diarrhea. Telemetry unremarkable, high-sensitivity troponin negative, no EKG performed.   Dehydration: Resolved.  I have seen and examined this patient myself. I have spent 30 minutes in his evaluation and care.  DVT prophylaxis: enoxaparin Code Status: Full Family Communication: wife at bedside 5/7 Disposition Plan: The patient is from home. Plan is for discharge to SNF. Barriers to discharge: Awaiting SNF bed. The patient is medically cleared for discharge.  Kessie Croston, DO [facility] Direct contact: see [email or web address]  7PM-7AM contact night coverage as above 08/13/2019,11:13 AM  LOS: 5 days

## 2019-08-13 NOTE — Progress Notes (Signed)
Physical Therapy Treatment Patient Details Name: Seth Mcpherson MRN: [id number] DOB: 10-16-1945 Today's Date: 08/13/2019    History of Present Illness Per MD notes: Pt is a 74 y.o. male with a history of diabetes, hypertension, Parkinson's, dementia, irritable bowel syndrome, and prostate cancer.  The patient apparently had been having difficulty with a flareup of his irritable bowel syndrome symptoms over the past few months, resulting in increased episodes of diarrhea.  Apparently he went to get up from his bed when he became lightheaded and passed out, falling onto his right hip.  A CT scan of his pelvis was obtained which demonstrated a comminuted mildly displaced acetabular fracture.  Pt to be managed conservatively.    PT Comments    Pt was awake in long sitting upon arriving. He greets therapist and reports feeling well this date. No pain reported throughout session. He was eager to get OOB to recliner. Cognition much improved from previous sessions observed. He was able to exit L sid eof bed with increased time + max assist. Stood two times EOB to RW with mod assist from elevated surface prior to stand pivot on LLE to recliner. Pt does great job adhering to proper NWB RLE. Increased time to transfer to recliner. Unable to " hop" on LLE as of now. Will continue to progress as able. He was seated in recliner at conclusion of session with chair alarm in place and RN aware of pt's mobility and precautions. Recommend DC to SNF when stable to continue to progress pt with strength, balance, and safe functional mobility.     Follow Up Recommendations  SNF     Equipment Recommendations  None recommended by PT    Recommendations for Other Services       Precautions / Restrictions Precautions Precautions: Fall Restrictions Weight Bearing Restrictions: Yes RLE Weight Bearing: Non weight bearing    Mobility  Bed Mobility Overal bed mobility: Needs Assistance Bed Mobility: Supine to  Sit Rolling: Max assist Sidelying to sit: Max assist Supine to sit: Max assist;HOB elevated     General bed mobility comments: Pt requires increased time and constant vcs for exiting L side of bed. Max assist to achieve short sit EOB. slight tremors upon sitting up that resolve after sitting for ~ 20 sec.   Transfers Overall transfer level: Needs assistance Equipment used: Rolling walker (2 wheeled) Transfers: Sit to/from [facility] Sit to Stand: Mod assist;From elevated surface Stand pivot transfers: Mod assist;From elevated surface       General transfer comment: Pt was able to stand 2 x EOB prior to stand pivot to recliner. unable to " hop" but was able to maintain proper NWB RLE while pivoting on LLE. incraesed time however pt tolerated well.  Ambulation/Gait             General Gait Details: unable/unsafe to trial at this time   Stairs             Wheelchair Mobility    Modified Rankin (Stroke Patients Only)       Balance                                            Cognition Arousal/Alertness: Awake/alert Behavior During Therapy: WFL for tasks assessed/performed Overall Cognitive Status: History of cognitive impairments - at baseline  General Comments: Overall cognition is improved from previous session observed. He was very cooperative and motivated for OOB activity      Exercises      General Comments        Pertinent Vitals/Pain Pain Assessment: No/denies pain Pain Score: 0-No pain Faces Pain Scale: No hurt Pain Location: RLE Pain Descriptors / Indicators: Constant;Sore Pain Intervention(s): Limited activity within patient's tolerance;Monitored during session    Home Living                      Prior Function            PT Goals (current goals can now be found in the care plan section) Acute Rehab PT Goals Patient Stated Goal: none  stated Progress towards PT goals: Progressing toward goals    Frequency    7X/week      PT Plan Current plan remains appropriate    Co-evaluation              AM-PAC PT "6 Clicks" Mobility   Outcome Measure  Help needed turning from your back to your side while in a flat bed without using bedrails?: A Lot Help needed moving from lying on your back to sitting on the side of a flat bed without using bedrails?: A Lot Help needed moving to and from a bed to a chair (including a wheelchair)?: A Lot Help needed standing up from a chair using your arms (e.g., wheelchair or bedside chair)?: A Lot Help needed to walk in hospital room?: A Lot Help needed climbing 3-5 steps with a railing? : Total 6 Click Score: 11    End of Session Equipment Utilized During Treatment: Gait belt Activity Tolerance: Patient tolerated treatment well Patient left: in chair;with call bell/phone within reach;with chair alarm set Nurse Communication: Mobility status PT Visit Diagnosis: Muscle weakness (generalized) (M62.81) Pain - Right/Left: Right Pain - part of body: Hip     Time: [id number] PT Time Calculation (min) (ACUTE ONLY): 19 min  Charges:  $Therapeutic Activity: 8-22 mins                     Julaine Fusi PTA 08/13/19, 10:22 AM

## 2019-08-14 LAB — GLUCOSE, CAPILLARY
Glucose-Capillary: 106 mg/dL — ABNORMAL HIGH (ref 70–99)
Glucose-Capillary: 154 mg/dL — ABNORMAL HIGH (ref 70–99)
Glucose-Capillary: 171 mg/dL — ABNORMAL HIGH (ref 70–99)
Glucose-Capillary: 98 mg/dL (ref 70–99)

## 2019-08-14 LAB — SARS CORONAVIRUS 2 (TAT 6-24 HRS): SARS Coronavirus 2: NEGATIVE

## 2019-08-14 NOTE — Progress Notes (Signed)
Physical Therapy Treatment Patient Details Name: Seth Mcpherson MRN: [id number] DOB: [birth year] Today's Date: 08/14/2019    History of Present Illness Per MD notes: Pt is a 74 y.o. male with a history of diabetes, hypertension, Parkinson's, dementia, irritable bowel syndrome, and prostate cancer.  The patient apparently had been having difficulty with a flareup of his irritable bowel syndrome symptoms over the past few months, resulting in increased episodes of diarrhea.  Apparently he went to get up from his bed when he became lightheaded and passed out, falling onto his right hip.  A CT scan of his pelvis was obtained which demonstrated a comminuted mildly displaced acetabular fracture.  Pt to be managed conservatively.    PT Comments    Pt inc soft BM upon arrival.  Tech in to assist with care.  Does well rolling left/right with rail and able to hold position well.  To EOB with rail and increased time today but only requires light assist to get trunk fully off bed today with is a good improvement.  Once sitting, he is generally steady but needs close supervision for safety as balance deficits and ability to recover remain limited.  Stood with min a x 2 and transferred to chair with min a x 2 at bedside.  He is able to recall WB status and maintain it during transfer.  Tech in for transfer training during session.   Follow Up Recommendations  SNF     Equipment Recommendations  None recommended by PT    Recommendations for Other Services       Precautions / Restrictions Precautions Precautions: Fall Restrictions Weight Bearing Restrictions: Yes RLE Weight Bearing: Non weight bearing    Mobility  Bed Mobility Overal bed mobility: Needs Assistance Bed Mobility: Supine to Sit;Rolling Rolling: Min assist   Supine to sit: Min assist     General bed mobility comments: excellent effort today with only little assist given to get fully upright.  Transfers Overall transfer level: Needs  assistance Equipment used: Rolling walker (2 wheeled) Transfers: Sit to/from Stand   Stand pivot transfers: Min assist;+2 safety/equipment       General transfer comment: tech in for transfer training.  Encouraged +2 for safety but overall pt did very well and able to mainitan NWB RLE  Ambulation/Gait Ambulation/Gait assistance: Min assist;+2 physical assistance Gait Distance (Feet): 3 Feet Assistive device: Rolling walker (2 wheeled) Gait Pattern/deviations: Step-to pattern Gait velocity: decreased   General Gait Details: able to maintian NWB for transfer   Stairs             Wheelchair Mobility    Modified Rankin (Stroke Patients Only)       Balance Overall balance assessment: Needs assistance Sitting-balance support: Feet supported Sitting balance-Leahy Scale: Fair Sitting balance - Comments: continues to progress but needs min gaurd/assist for safety.  unsafe to be left unattended.   Standing balance support: Bilateral upper extremity supported;During functional activity Standing balance-Leahy Scale: Poor Standing balance comment: once in standing, pt able to mainrain static standing with BUE support  with CGA.  Continues to need hands on assist at all times.                            Cognition Arousal/Alertness: Awake/alert Behavior During Therapy: WFL for tasks assessed/performed Overall Cognitive Status: History of cognitive impairments - at baseline  General Comments: Overall cognition is improved from previous session observed. He was very cooperative and motivated for OOB activity      Exercises Other Exercises Other Exercises: rolling left/right with rail and min assist for care due to inc soft BM    General Comments        Pertinent Vitals/Pain Pain Assessment: No/denies pain Pain Intervention(s): Monitored during session    Home Living                      Prior Function             PT Goals (current goals can now be found in the care plan section) Progress towards PT goals: Progressing toward goals    Frequency    7X/week      PT Plan Current plan remains appropriate    Co-evaluation              AM-PAC PT "6 Clicks" Mobility   Outcome Measure  Help needed turning from your back to your side while in a flat bed without using bedrails?: A Little Help needed moving from lying on your back to sitting on the side of a flat bed without using bedrails?: A Lot Help needed moving to and from a bed to a chair (including a wheelchair)?: A Lot Help needed standing up from a chair using your arms (e.g., wheelchair or bedside chair)?: A Lot Help needed to walk in hospital room?: A Lot Help needed climbing 3-5 steps with a railing? : Total 6 Click Score: 12    End of Session Equipment Utilized During Treatment: Gait belt Activity Tolerance: Patient tolerated treatment well Patient left: in chair;with call bell/phone within reach;with chair alarm set;with nursing/sitter in room Nurse Communication: Mobility status Pain - Right/Left: Right Pain - part of body: Hip     Time: [id number] PT Time Calculation (min) (ACUTE ONLY): 12 min  Charges:  $Therapeutic Activity: 8-22 mins                    Chesley Noon, PTA 08/14/19, 9:17 AM

## 2019-08-14 NOTE — Progress Notes (Signed)
Accucheck machines did not transfer lunch or dinnertime readings.Pt with lunch time CBG in the 90s no coverage needed Prior to dinner pts CBG was 84 nurse asked aid to recheck after dinner and reading was 174 this reading did transfer and is under results tab.  Will monitor

## 2019-08-14 NOTE — Progress Notes (Signed)
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 Found to have complex acetabular fracture, orthopedics at [facility] felt patient needed higher level of care. No beds available at [place] or [facility] at that time, placed on [facility] waitlist. Patient admitted for pain control until transfer to [facility] accomplished. However, the patient has been evaluated by trauma surgery at [facility]. They feel that due to the patient's multiple comorbidities that he is not a good surgical candidate. The plan now is for the patient to discharge to [facility]. He is medically stable for discharge.  Consultants  . Orthopedic surgery  Procedures  . None  Antibiotics   Anti-infectives (From admission, onward)   None     Subjective  The patient is sitting up in a chair. No new complaints.  Objective   Vitals:  Vitals:   08/14/19 0728 08/14/19 1105  BP: 121/70 116/81  Pulse: 69 77  Resp:    Temp: 98.6 F (37 C) 98.1 F (36.7 C)  SpO2: 96% 97%   Exam:  Constitutional:  . The patient is awake and alert. No acute distress. Respiratory:  . No increased work of breathing. . No wheezes, rales, or rhonchi . No tactile fremitus Cardiovascular:  . Regular rate and rhythm . No murmurs, ectopy, or gallups. . No lateral PMI. No thrills. Abdomen:  . Abdomen is soft, non-tender, non-distended . No hernias, masses, or organomegaly . Normoactive bowel sounds.  Musculoskeletal:  . No cyanosis, clubbing, or edema Skin:  . There is a erythematous papular rash on his back that is puritic. It is less erythematous today. . palpation of skin: no induration or nodules Neurologic:  . CN 2-12 intact . Sensation all 4 extremities intact Psychiatric:  . Mental status o Mood, affect  appropriate o Orientation to person, place, time  . judgment and insight appear intact  I have personally reviewed the following:   Today's Data  . Vitals, glucoses  Imaging  . X-ray of the pelvis: Comminuted right acetabular fracture with associated fracture of the right inferior pubic ramus.   Scheduled Meds: . aspirin EC  81 mg Oral Daily  . atorvastatin  40 mg Oral Daily  . carbidopa-levodopa  1 tablet Oral QID  . enoxaparin (LOVENOX) injection  40 mg Subcutaneous Q24H  . folic acid  3 mg Oral Daily  . hydrocortisone cream   Topical TID  . insulin aspart  0-9 Units Subcutaneous Q4H  . Methotrexate  0.8 mL Subcutaneous Weekly  . PARoxetine  40 mg Oral Daily  . rivastigmine  1.5 mg Oral BID  . senna-docusate  1 tablet Oral QHS  . tamsulosin  0.4 mg Oral Daily   Continuous Infusions:  Principal Problem:   Closed right acetabular fracture (HCC) Active Problems:   Chronic diarrhea   Type 2 diabetes mellitus without complication (HCC)   Essential hypertension   Parkinson disease (HCC)   Cognitive deficit due to Parkinson's disease ([place])   Syncope   Hypotension   AKI (acute kidney injury) ([place])   Acetabular fracture ([place])   LOS: 9 days   A & P   Closed right acetabular fracture, complex: Continue physical therapy and pain control. The patient is deemed a poor candidate for surgery. Plan is for the patient to discharge to SNF ([facility]). Although toe-touch weightbearing is technically allowed, the patient is not able to perform this secondary to multiple factors.  Therefore orthopedics has recommended no weightbearing right lower extremity. Repeat radiographs of the pelvis as per orthopedics demonstrated. Comminuted right acetabular fracture with associated fracture of the right inferior pubic ramus. Dr. Roland Rack plans to followup in outpatient setting. The patient is awaiting SNF placement. COVID repeated 08/14/2019.  Diabetes mellitus type 2: CBG remains stable.   Glucoses have been 98 - 126 over the past 24 hours. Continue sliding scale insulin. Resume metformin on discharge.  Rash: Likely contact dermatitis. Improving. I have ordered a low doses topical steroid.  Parkinson's disease with associated dementia: Noted. Stable Continue sinemet and exelon.   Irritable bowel syndrome with chronic diarrhea: However, the patient is currently quite constipated. The patient's wife state that patient frequently get megacolon. The patient has had 2-3 "moderate" BM's in the last 24 hours.  Syncope secondary to autonomic instability, possible orthostatic hypotension with mild dehydration: This was probably related to diarrhea. Telemetry unremarkable, high-sensitivity troponin negative, no EKG performed.   Dehydration: Resolved.  I have seen and examined this patient myself. I have spent 30 minutes in his evaluation and care.  DVT prophylaxis: enoxaparin Code Status: Full Family Communication: wife at bedside 5/7 Disposition Plan: The patient is from home. Plan is for discharge to SNF. Barriers to discharge: Awaiting SNF bed. The patient is medically cleared for discharge.  Prinston Kynard, DO [facility] Direct contact: see [email or web address]  7PM-7AM contact night coverage as above 08/14/2019,11:55 AM  LOS: 5 days

## 2019-08-15 LAB — GLUCOSE, CAPILLARY
Glucose-Capillary: 110 mg/dL — ABNORMAL HIGH (ref 70–99)
Glucose-Capillary: 110 mg/dL — ABNORMAL HIGH (ref 70–99)
Glucose-Capillary: 150 mg/dL — ABNORMAL HIGH (ref 70–99)
Glucose-Capillary: 84 mg/dL (ref 70–99)
Glucose-Capillary: 104 mg/dL — ABNORMAL HIGH (ref 70–99)
Glucose-Capillary: 114 mg/dL — ABNORMAL HIGH (ref 70–99)
Glucose-Capillary: 126 mg/dL — ABNORMAL HIGH (ref 70–99)
Glucose-Capillary: 84 mg/dL (ref 70–99)
Glucose-Capillary: 96 mg/dL (ref 70–99)

## 2019-08-15 MED ORDER — PAROXETINE HCL 40 MG PO TABS: 40.0000 mg | ORAL_TABLET | Freq: Every day | ORAL | 0 refills | Status: DC

## 2019-08-15 MED ORDER — HYDROCODONE-ACETAMINOPHEN 5-325 MG PO TABS: 1.0000 | ORAL_TABLET | Freq: Four times a day (QID) | ORAL | 0 refills | Status: DC | PRN

## 2019-08-15 MED ORDER — HYDROCORTISONE 0.5 % EX CREA: TOPICAL_CREAM | Freq: Three times a day (TID) | CUTANEOUS | 0 refills | Status: DC

## 2019-08-15 MED ORDER — RIVASTIGMINE TARTRATE 1.5 MG PO CAPS: 1.5000 mg | ORAL_CAPSULE | Freq: Two times a day (BID) | ORAL | 0 refills | Status: DC

## 2019-08-15 MED ORDER — SENNOSIDES-DOCUSATE SODIUM 8.6-50 MG PO TABS: 1.0000 | ORAL_TABLET | Freq: Every day | ORAL | 0 refills | Status: DC

## 2019-08-15 MED FILL — Methotrexate Sodium Inj 50 MG/2ML (25 MG/ML): INTRAMUSCULAR | Qty: 2 | Status: AC

## 2019-08-15 NOTE — TOC Initial Note (Signed)
Transition of Care [facility]) - Initial/Assessment Note    Patient Details  Name: Seth Mcpherson MRN: [id number] Date of Birth: 1945-10-29  Transition of Care [facility]) CM/SW Contact:    Shelbie Ammons, RN Phone Number: 08/15/2019, 3:44 PM  Clinical Narrative:   RNCM called Tammy with [employer] and insurance Josem Kaufmann has been obtained. Message sent to MD to make aware. Spoke with patient and wife at bedside, they are aware that patient will likely d/c to facility today. Patient will be going to room 709 and report can be called to [phone]. Gerald Stabs, RN will call for transport.                 Expected Discharge Plan: (SNF vs. CIR) Barriers to Discharge: Barriers Resolved   Patient Goals and CMS Choice Patient states their goals for this hospitalization and ongoing recovery are:: Pt is agreeable to rehab CMS Medicare.gov Compare Post Acute Care list provided to:: Patient Choice offered to / list presented to : Patient  Expected Discharge Plan and Services Expected Discharge Plan: (SNF vs. CIR)     Post Acute Care Choice: (SNF vs. CIR) Living arrangements for the past 2 months: Single Family Home Expected Discharge Date: 08/15/19                                    Prior Living Arrangements/Services Living arrangements for the past 2 months: Single Family Home Lives with:: Spouse Patient language and need for interpreter reviewed:: No Do you feel safe going back to the place where you live?: Yes      Need for Family Participation in Patient Care: Yes (Comment) Care giver support system in place?: Yes (comment)   Criminal Activity/Legal Involvement Pertinent to Current Situation/Hospitalization: No - Comment as needed  Activities of Daily Living Home Assistive Devices/Equipment: Blood pressure cuff, Walker (specify type), Grab bars in shower, Eyeglasses ADL Screening (condition at time of admission) Patient's cognitive ability adequate to safely complete daily activities?: Yes Is the  patient deaf or have difficulty hearing?: No Does the patient have difficulty seeing, even when wearing glasses/contacts?: No Does the patient have difficulty concentrating, remembering, or making decisions?: No Patient able to express need for assistance with ADLs?: Yes Does the patient have difficulty dressing or bathing?: No Independently performs ADLs?: No Communication: Independent Dressing (OT): Independent Grooming: Independent Feeding: Independent Bathing: Independent Toileting: Needs assistance Is this a change from baseline?: Pre-admission baseline In/Out Bed: Needs assistance Is this a change from baseline?: Pre-admission baseline Walks in Home: Needs assistance Is this a change from baseline?: Pre-admission baseline Does the patient have difficulty walking or climbing stairs?: Yes Weakness of Legs: Both Weakness of Arms/Hands: None  Permission Sought/Granted Permission sought to share information with : Case Manager Permission granted to share information with : Yes, Release of Information Signed     Permission granted to share info w AGENCY: All SNF        Emotional Assessment Appearance:: Appears stated age Attitude/Demeanor/Rapport: Engaged Affect (typically observed): Calm Orientation: : Oriented to Self, Oriented to Place, Oriented to  Time, Oriented to Situation Alcohol / Substance Use: Not Applicable Psych Involvement: No (comment)  Admission diagnosis:  Syncope [R55] AKI (acute kidney injury) ([place]) [N17.9] Syncope, unspecified syncope type [R55] Closed displaced fracture of right acetabulum, unspecified portion of acetabulum, initial encounter ([place]) [S32.401A] Acetabular fracture ([place]) [S32.409A] Patient Active Problem List   Diagnosis Date Noted  . Chronic diarrhea  08/05/2019  . Closed right acetabular fracture ([place]) 08/05/2019  . Type 2 diabetes mellitus without complication ([place]) 0000000  . Essential hypertension 08/05/2019  . Parkinson  disease ([place]) 08/05/2019  . Cognitive deficit due to Parkinson's disease ([place]) 08/05/2019  . Syncope 08/05/2019  . Hypotension 08/05/2019  . AKI (acute kidney injury) ([place]) 08/05/2019  . Acetabular fracture ([place]) 08/05/2019   PCP:  System, Pcp Not In Pharmacy:   [facility] [address] [place] - [place] [address] Phone: [phone] Fax: [phone]     Social Determinants of Health (SDOH) Interventions    Readmission Risk Interventions No flowsheet data found.

## 2019-08-15 NOTE — Care Management Important Message (Signed)
Important Message  Patient Details  Name: Seth Mcpherson MRN: [id number] Date of Birth: 1945-11-19   Medicare Important Message Given:  Yes     [name] A Susann Lawhorne 08/15/2019, 11:24 AM

## 2019-08-15 NOTE — Progress Notes (Signed)
Report Called to Yaakov Guthrie at [employer]

## 2019-08-15 NOTE — Progress Notes (Signed)
Physical Therapy Treatment Patient Details Name: Seth Mcpherson MRN: [id number] DOB: October 18, 1945 Today's Date: 08/15/2019    History of Present Illness Per MD notes: Pt is a 74 y.o. male with a history of diabetes, hypertension, Parkinson's, dementia, irritable bowel syndrome, and prostate cancer.  The patient apparently had been having difficulty with a flareup of his irritable bowel syndrome symptoms over the past few months, resulting in increased episodes of diarrhea.  Apparently he went to get up from his bed when he became lightheaded and passed out, falling onto his right hip.  A CT scan of his pelvis was obtained which demonstrated a comminuted mildly displaced acetabular fracture.  Pt to be managed conservatively.    PT Comments    Pt ready for session.  Little to no pain reported.  To EOB today with rails and verbal cues with increased time but no physical assist needed.  Excellent progression.  Session focused on lateral scoot transfers to drop arm recliner.  Left and right for a total of 3 transfers.  While he requires increased time, no physical assist is needed.  Supervision for safety, cues to technique and assist to hold chair for stability.  No LOB noted today in sitting,  Remained in recliner after session.  Wife voiced frustration regarding no decision from insurance company at this time.  She is frustrated with process and is unsure of why it is taking so long.  Discussed options of returning home if SNF is declined.  Session focused on ways to assist pt at home if that is needed for discharge.  He did quite well with lateral scoot transfer to/from drop arm recliner.  Discussed assistance needs at home and problem solving.  She stated she is having her carpets cleaned Wednesday and does not feel as if she would be ready to take him home until Sunday if it is needed.  Still prefers SNF at this time as a bridge but feels more comfortable with idea of going home if needed.   Pt and wife  left to discuss back-up options if SNF is not approved.     Follow Up Recommendations  SNF     Equipment Recommendations  Wheelchair, cushion, drop arm commode, hospital bed, RW  If discharged home.   Recommendations for Other Services       Precautions / Restrictions Precautions Precautions: Fall Restrictions Weight Bearing Restrictions: Yes RLE Weight Bearing: Non weight bearing Other Position/Activity Restrictions: Can place ttwb RLE but encourae NWB due to poor ability to ttwb.    Mobility  Bed Mobility Overal bed mobility: Needs Assistance Bed Mobility: Supine to Sit Rolling: Min assist   Supine to sit: Supervision     General bed mobility comments: with increased time and use of rails he is able to come to sitting with only cues and encouragement today.  Transfers Overall transfer level: Needs assistance Equipment used: Sliding board Transfers: Lateral/Scoot Transfers          Lateral/Scoot Transfers: Supervision;Min guard General transfer comment: focused on lateral scoot transfers to/from drop arm recliner.  excellent progression.  Ambulation/Gait                 Stairs             Wheelchair Mobility    Modified Rankin (Stroke Patients Only)       Balance Overall balance assessment: Needs assistance Sitting-balance support: Feet supported Sitting balance-Leahy Scale: Good Sitting balance - Comments: no physical assist needed for lateral scoot  transfers today.  supervision for safety.                                    Cognition Arousal/Alertness: Awake/alert Behavior During Therapy: WFL for tasks assessed/performed Overall Cognitive Status: History of cognitive impairments - at baseline                                        Exercises Total Joint Exercises Ankle Circles/Pumps: AROM;Both;10 reps Short Arc Quad: AROM;AAROM;Both;10 reps Heel Slides: AAROM;AROM;Both;10 reps Hip  ABduction/ADduction: AAROM;AROM;Both;10 reps Long Arc Quad: AROM;Strengthening;Both;5 reps;10 reps Knee Flexion: AROM;Strengthening;Both;5 reps;10 reps    General Comments        Pertinent Vitals/Pain Pain Assessment: No/denies pain    Home Living                      Prior Function            PT Goals (current goals can now be found in the care plan section) Progress towards PT goals: Progressing toward goals    Frequency    7X/week      PT Plan Current plan remains appropriate;Other (comment)    Co-evaluation              AM-PAC PT "6 Clicks" Mobility   Outcome Measure  Help needed turning from your back to your side while in a flat bed without using bedrails?: A Little Help needed moving from lying on your back to sitting on the side of a flat bed without using bedrails?: A Little Help needed moving to and from a bed to a chair (including a wheelchair)?: A Little Help needed standing up from a chair using your arms (e.g., wheelchair or bedside chair)?: A Lot Help needed to walk in hospital room?: A Lot Help needed climbing 3-5 steps with a railing? : Total 6 Click Score: 14    End of Session Equipment Utilized During Treatment: Gait belt Activity Tolerance: Patient tolerated treatment well Patient left: in chair;with call bell/phone within reach;with chair alarm set Nurse Communication: Mobility status Pain - Right/Left: Right Pain - part of body: Hip     Time: [id number] PT Time Calculation (min) (ACUTE ONLY): 40 min  Charges:  $Therapeutic Exercise: 8-22 mins $Therapeutic Activity: 23-37 mins                    Chesley Noon, PTA 08/15/19, 11:17 AM

## 2019-08-15 NOTE — Discharge Summary (Signed)
Physician Discharge Summary  Seth Mcpherson [id number] DOB: [birth year] DOA: 08/04/2019  PCP: System, Pcp Not In  Admit date: 08/04/2019 Discharge date: 08/15/2019  Recommendations for Outpatient Follow-up:  1. Follow up with PCP in 7-10 days after discharge from facility. 2. Follow up with Orthopedic surgery as directed. 3. Enema if no BM at least every other day. 4. NWB on right lower extremity. He may be TT WB on Rt lower extremity, but due to difficulty with this instruction, encourage NWB status on Rt LE.  Contact information for after-discharge care    Destination    [facility] .   Service: Skilled Nursing Contact information: [address] [place] [phone]             Discharge Diagnoses: Principal diagnosis is #1 1. Closed right acetabular fracture 2. DM II without complication 3. Cognitive deficits due to Parkinson's disease 4. Parkinson's disease 5. IBS 6. Essential hypertension 7. Syncope 8. 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  His wife who gives most of the history said he got up out of bed and as he was leaving the bedroom he passed out and fell onto his hip.  She states that patient has been having worsening diarrhea for the past 6 months related to his IBS and was very cautious about what he was eating and drinking and has been having decreased oral intake as a result.  ED Course: On arrival to the emergency room he had a  soft blood pressure of 100/65 which dipped as low as systolic in the [id number] but rebounded following IV fluids hydration.  Blood work significant for a slightly elevated creatinine of 1.29 above his baseline of 0.9.  CT of the hip showed a highly comminuted and displaced complex right acetabular fracture.  The emergency room provider spoke with orthopedist, Dr. Roland Rack who stated that the fracture was too complex to be managed at this hospital.  They subsequently called several hospitals including [facility], [facility], [facility] all of whom had no bed availability.  [facility] did place patient on the waiting list and expects patient to have a bed in over 12 hours so patient will be admitted to this hospital while awaiting transfer to [facility].  Dr. Roland Rack is aware.  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 Found to have complex acetabular fracture, orthopedics at [facility] felt patient needed higher level of care. No beds available at [place] or [facility] at that time, placed on [facility] waitlist. Patient admitted for pain control until transfer to [facility] accomplished. However, the patient has been evaluated by trauma surgery at [facility]. They feel that due to the patient's multiple comorbidities that he is not a good surgical candidate. The plan now is for the patient to discharge to [facility]. He is medically stable for discharge.  Today's assessment: S: The patient is sitting up at bedside. He is without  new complaints. O: Vitals:  Vitals:   08/15/19 0733 08/15/19 1222  BP: 122/74 125/80  Pulse: 64 77  Resp:    Temp: 98.6 F (37 C)   SpO2: 97% 95%    Exam:  Constitutional:   The patient is awake and alert. No acute distress. Respiratory:   No increased work of breathing.  No wheezes, rales, or rhonchi  No tactile fremitus Cardiovascular:   Regular rate and  rhythm  No murmurs, ectopy, or gallups.  No lateral PMI. No thrills. Abdomen:   Abdomen is soft, non-tender, non-distended  No hernias, masses, or organomegaly  Normoactive bowel sounds.  Musculoskeletal:   No cyanosis, clubbing, or edema Skin:   There is a erythematous papular rash on his back that is puritic. It is less erythematous today.  palpation of skin: no induration or nodules Neurologic:   CN 2-12 intact  Sensation all 4 extremities intact Psychiatric:   Mental status ? Mood, affect appropriate ? Orientation to person, place, time   judgment and insight appear intact   Discharge Instructions  Discharge Instructions    Activity as tolerated - No restrictions   Complete by: As directed    Call MD for:  redness, tenderness, or signs of infection (pain, swelling, redness, odor or green/yellow discharge around incision site)   Complete by: As directed    Call MD for:  severe uncontrolled pain   Complete by: As directed    Call MD for:  temperature >100.4   Complete by: As directed    Diet - low sodium heart healthy   Complete by: As directed    Diet Carb Modified   Complete by: As directed    Discharge instructions   Complete by: As directed    Follow up with PCP in 7-10 days after discharge from facility. Follow up with Orthopedic surgery as directed. Enema if no BM at least every other day. NWB on right lower extremity. He may be TT WB on Rt lower extremity, but due to difficulty with this instruction, encourage NWB status on Rt LE.   Increase activity slowly   Complete by: As directed      Allergies as of 08/15/2019   No Known Allergies     Medication List    STOP taking these medications   DULoxetine 60 MG capsule Commonly known as: CYMBALTA   lisinopril 20 MG tablet Commonly known as: ZESTRIL     TAKE these medications   Armodafinil 200 MG Tabs Take 200 mg by mouth daily.   aspirin EC 81 MG tablet Take 81 mg by mouth daily.     atorvastatin 40 MG tablet Commonly known as: LIPITOR Take 40 mg by mouth daily.   carbidopa-levodopa 25-100 MG tablet Commonly known as: SINEMET IR Take 1 tablet by mouth 4 (four) times daily.   folic acid 1 MG tablet Commonly known as: FOLVITE Take 3 mg by mouth daily.   HYDROcodone-acetaminophen 5-325 MG tablet Commonly known as: NORCO/VICODIN Take 1-2 tablets by mouth every 6 (six) hours as needed for moderate pain or severe pain.   hydrocortisone cream 0.5 % Apply topically 3 (three) times daily.   metFORMIN 1000 MG tablet Commonly known as: GLUCOPHAGE Take 1,000 mg by mouth 2 (two) times daily with a meal.   Methotrexate 25 MG/ML Sosy Inject 0.8 mLs into the skin every Monday.   PARoxetine 40 MG tablet Commonly known as: PAXIL Take 1 tablet (40 mg total) by mouth daily. Start taking on: Aug 16, 2019   rivastigmine 1.5 MG capsule Commonly known as: EXELON Take 1 capsule (1.5 mg total) by mouth 2 (two) times daily.   senna-docusate 8.6-50 MG tablet Commonly known as: Senokot-S Take 1 tablet by mouth at bedtime.   tamsulosin 0.4 MG Caps capsule Commonly known as: FLOMAX Take 0.4 mg by mouth.            Durable Medical Equipment  (From admission, onward)         Start     Ordered   08/15/19 1459  DME Walker  Once    Question Answer Comment  Walker: With 5 Inch Wheels   Patient needs a walker to treat with the following condition Ambulatory dysfunction      08/15/19 1501   08/15/19 1459  DME 3-in-1  Once     08/15/19 1501         No Known Allergies  The results of significant diagnostics from this hospitalization (including imaging, microbiology, ancillary and laboratory) are listed below for reference.    Significant Diagnostic Studies: CT Head Wo Contrast  Result Date: 08/04/2019 CLINICAL DATA:  Fall EXAM: CT HEAD WITHOUT CONTRAST TECHNIQUE: Contiguous axial images were obtained from the base of the skull through the vertex without  intravenous contrast. COMPARISON:  None. FINDINGS: Brain: There is no mass, hemorrhage or extra-axial collection. There is generalized atrophy without lobar predilection. Hypodensity of the white matter is most commonly associated with chronic microvascular disease. Vascular: No abnormal hyperdensity of the major intracranial arteries or dural venous sinuses. No intracranial atherosclerosis. Skull: The visualized skull base, calvarium and extracranial soft tissues are normal. Sinuses/Orbits: No fluid levels or advanced mucosal thickening of the visualized paranasal sinuses. No mastoid or middle ear effusion. The orbits are normal. IMPRESSION: Generalized atrophy and chronic microvascular ischemia without acute intracranial abnormality. Electronically Signed   By: Ulyses Jarred M.D.   On: 08/04/2019 22:30   DG Pelvis Comp Min 3V  Result Date: 08/10/2019 CLINICAL DATA:  Fall. Acetabular fracture. EXAM: JUDET PELVIS - 3+ VIEW COMPARISON:  CT scan 08/04/2019. FINDINGS: Three views study shows diffuse osteopenia. Comminuted right acetabular fracture again noted with associated fractures of the right inferior pubic ramus. Prominent stool volume in the rectum. IMPRESSION: Comminuted right acetabular fracture with associated fracture of the right inferior pubic ramus. Electronically Signed   By: Misty Stanley M.D.   On: 08/10/2019 10:34   CT Hip Right Wo Contrast  Result Date: 08/04/2019 CLINICAL DATA:  Post fall with right hip pain. Hip trauma, fracture suspected, no prior imaging EXAM: CT OF THE RIGHT HIP WITHOUT CONTRAST TECHNIQUE: Multidetector CT imaging of the right hip was performed according to the standard protocol. Multiplanar CT image reconstructions were also generated. COMPARISON:  None. FINDINGS: Bones/Joint/Cartilage Highly comminuted complex acetabular fracture with multifocal articular involvement. There is articular distraction of 8 mm as well as step of up to 4 mm. There is slight protrusion of  dominant fracture fragments. Fracture extends to involve the anterior iliac crest. Fracture involves the puboacetabular junction. There is a comminuted and displaced right inferior pubic ramus fracture. The sacroiliac joint is congruent were visualized. No definite right sacral fracture. Ligaments Suboptimally assessed by CT. Muscles and Tendons Intramuscular hemorrhage involving the iliopsoas and obturator muscles related to acetabular fracture. No evidence of gluteal hematoma. Soft tissues Mild soft tissue edema. Enlarged prostate gland is partially included. IMPRESSION: 1. Highly comminuted and displaced complex right acetabular fracture with multifocal articular involvement. 2. Fracture also involves the puboacetabular  junction as well as extends superiorly to involve the anterior iliac crest. 3. Comminuted and displaced right inferior pubic ramus fracture. 4. Intramuscular hemorrhage involving the iliopsoas and obturator muscles related to acetabular fracture. Electronically Signed   By: Keith Rake M.D.   On: 08/04/2019 22:34    Microbiology: Recent Results (from the past 240 hour(s))  Respiratory Panel by RT PCR (Flu A&B, Covid) - Nasopharyngeal Swab     Status: None   Collection Time: 08/08/19  7:30 PM   Specimen: Nasopharyngeal Swab  Result Value Ref Range Status   SARS Coronavirus 2 by RT PCR NEGATIVE NEGATIVE Final    Comment: (NOTE) SARS-CoV-2 target nucleic acids are NOT DETECTED. The SARS-CoV-2 RNA is generally detectable in upper respiratoy specimens during the acute phase of infection. The lowest concentration of SARS-CoV-2 viral copies this assay can detect is 131 copies/mL. A negative result does not preclude SARS-Cov-2 infection and should not be used as the sole basis for treatment or other patient management decisions. A negative result may occur with  improper specimen collection/handling, submission of specimen other than nasopharyngeal swab, presence of viral  mutation(s) within the areas targeted by this assay, and inadequate number of viral copies (<131 copies/mL). A negative result must be combined with clinical observations, patient history, and epidemiological information. The expected result is Negative. Fact Sheet for Patients:  PinkCheek.be Fact Sheet for Healthcare Providers:  GravelBags.it This test is not yet ap proved or cleared by the [place] FDA and  has been authorized for detection and/or diagnosis of SARS-CoV-2 by FDA under an Emergency Use Authorization (EUA). This EUA will remain  in effect (meaning this test can be used) for the duration of the COVID-19 declaration under Section 564(b)(1) of the Act, 21 U.S.C. section 360bbb-3(b)(1), unless the authorization is terminated or revoked sooner.    Influenza A by PCR NEGATIVE NEGATIVE Final   Influenza B by PCR NEGATIVE NEGATIVE Final    Comment: (NOTE) The Xpert Xpress SARS-CoV-2/FLU/RSV assay is intended as an aid in  the diagnosis of influenza from Nasopharyngeal swab specimens and  should not be used as a sole basis for treatment. Nasal washings and  aspirates are unacceptable for Xpert Xpress SARS-CoV-2/FLU/RSV  testing. Fact Sheet for Patients: PinkCheek.be Fact Sheet for Healthcare Providers: GravelBags.it This test is not yet approved or cleared by the [place] FDA and  has been authorized for detection and/or diagnosis of SARS-CoV-2 by  FDA under an Emergency Use Authorization (EUA). This EUA will remain  in effect (meaning this test can be used) for the duration of the  Covid-19 declaration under Section 564(b)(1) of the Act, 21  U.S.C. section 360bbb-3(b)(1), unless the authorization is  terminated or revoked. Performed at [facility], [place] [address]   SARS CORONAVIRUS 2 (TAT 6-24 HRS)  Nasopharyngeal Nasopharyngeal Swab     Status: None   Collection Time: 08/14/19  1:14 PM   Specimen: Nasopharyngeal Swab  Result Value Ref Range Status   SARS Coronavirus 2 NEGATIVE NEGATIVE Final    Comment: (NOTE) SARS-CoV-2 target nucleic acids are NOT DETECTED. The SARS-CoV-2 RNA is generally detectable in upper and lower respiratory specimens during the acute phase of infection. Negative results do not preclude SARS-CoV-2 infection, do not rule out co-infections with other pathogens, and should not be used as the sole basis for treatment or other patient management decisions. Negative results must be combined with clinical observations, patient history, and epidemiological information. The expected result  is Negative. Fact Sheet for Patients: SugarRoll.be Fact Sheet for Healthcare Providers: https://www.woods-mathews.com/ This test is not yet approved or cleared by the [place] FDA and  has been authorized for detection and/or diagnosis of SARS-CoV-2 by FDA under an Emergency Use Authorization (EUA). This EUA will remain  in effect (meaning this test can be used) for the duration of the COVID-19 declaration under Section 56 4(b)(1) of the Act, 21 U.S.C. section 360bbb-3(b)(1), unless the authorization is terminated or revoked sooner. Performed at [facility], [place] [address], [place],  [address]      Labs: Basic Metabolic Panel: Recent Labs  Lab 08/12/19 0505  CREATININE 0.78   Liver Function Tests: No results for input(s): AST, ALT, ALKPHOS, BILITOT, PROT, ALBUMIN in the last 168 hours. No results for input(s): LIPASE, AMYLASE in the last 168 hours. No results for input(s): AMMONIA in the last 168 hours. CBC: No results for input(s): WBC, NEUTROABS, HGB, HCT, MCV, PLT in the last 168 hours. Cardiac Enzymes: No results for input(s): CKTOTAL, CKMB, CKMBINDEX, TROPONINI in the last 168 hours. BNP: BNP (last 3  results) No results for input(s): BNP in the last 8760 hours.  ProBNP (last 3 results) No results for input(s): PROBNP in the last 8760 hours.  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  Signed:        Bethann Qualley, DO [facility]  08/15/2019, 3:02 PM

## 2019-08-22 ENCOUNTER — Other Ambulatory Visit: Payer: Self-pay

## 2019-08-22 ENCOUNTER — Non-Acute Institutional Stay: Payer: Medicare HMO | Admitting: Primary Care

## 2019-08-22 DIAGNOSIS — Z515 Encounter for palliative care: Secondary | ICD-10-CM

## 2019-08-22 NOTE — Progress Notes (Signed)
[facility] Consult Note Telephone: [phone]  Fax: [phone]  PATIENT NAME: Seth Mcpherson [address] [place] [address] [phone] (home)  DOB: [birth year] MRN: [id number]  PRIMARY CARE PROVIDER:    Juluis Pitch, MD [address] [place] [address] [phone]  REFERRING PROVIDER:   Juluis Pitch, MD [place],  [place] [address]   RESPONSIBLE PARTY:   Extended Emergency Contact Information Primary Emergency Contact: [facility] Phone: [phone] Relation: Spouse  I met with patient and family in the facility.  ASSESSMENT AND RECOMMENDATIONS:   1. Advance Care Planning/Goals of Care: Goals include to maximize quality of life and symptom management.  . Discussed goals of care of wanting to go home and continue rehab. No intervention for fx hip besides not weight bearing. Has MOST with full scope of intervention, full code.   2. 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 He sustained a fractured right acetabulum and pelvis which was not surgically repaired. He is here for nonweightbearing x eight weeks, and skilled therapy. This morning he got out of bed to the bathroom alone without help and on the nonweightbearing leg. He states he did not forget or get confused but that he just wanted to get up. We discussed his rehab, he and his wife are focused on him going back home to continue rehab with home health eventually. We discussed the modifications they could make at their house to let them to let him have maximum amount of DME that he would need.   We discussed non weightbearing for bone rebuilding. Patient also discussed some IBS-D chronic diarrhea that is not well controlled currently. Also  speaking with nutrition about possibly isolating food culprits. He did ask about he did ask about pain control. We were interrupted by a therapist needing  to work with him but I will review his needs vis a vis unset fracture but he would be at high risk for dependence with eight weeks of narcotics. I would recommend the continuous release (CR) around the clock Tylenol and ibuprofen if tolerated with his gut issues and renal function.   3. Family /Caregiver/Community Supports: IN SNF currently, wife is POA, lives locally.  Plan d/c with home health.   4. Cognitive / Functional decline: A and O x 1-2 but judgement impairment. Walked to bathroom today on NWB leg. Able to do adls but has to be NWB for 8 weeks.   5. Follow up Palliative Care Visit: Palliative care will continue to follow for goals of care clarification and symptom management. Return 2-4 weeks or prn.  I spent 35 minutes providing this consultation,  from 1500 to 1535. More than 50% of the time in this consultation was spent coordinating communication.   HISTORY OF PRESENT ILLNESS:  Seth Mcpherson is a 74 y.o. year old male with multiple medical problems including PD, cognitive impairment, fx right hip, no intervention, pain., prostate cancer Palliative Care was asked to follow this patient by consultation request of Juluis Pitch, MD to help address advance care planning and goals of care. This is the initial visit.  CODE STATUS: FULL CODE  PPS: 30% HOSPICE ELIGIBILITY/DIAGNOSIS: TBD  PAST MEDICAL HISTORY:  Past Medical History:  Diagnosis Date  . Cancer ([place])   . Dementia ([place])   . Diabetes mellitus without complication ([place])   . Hypertension   . Parkinson disease ([place])   . Prostate  cancer [facility])     SOCIAL HX:  Social History   Tobacco Use  . Smoking status: Former Research scientist (life sciences)  . Smokeless tobacco: Never Used  Substance Use Topics  . Alcohol use: Not Currently    ALLERGIES: No Known Allergies   PERTINENT MEDICATIONS:  Outpatient Encounter Medications as of 08/22/2019  Medication Sig  . Armodafinil 200 MG TABS Take 200 mg by mouth daily.  Marland Kitchen aspirin EC 81 MG tablet  Take 81 mg by mouth daily.  Marland Kitchen atorvastatin (LIPITOR) 40 MG tablet Take 40 mg by mouth daily.  . carbidopa-levodopa (SINEMET IR) 25-100 MG tablet Take 1 tablet by mouth 4 (four) times daily.   . folic acid (FOLVITE) 1 MG tablet Take 3 mg by mouth daily.  Marland Kitchen HYDROcodone-acetaminophen (NORCO/VICODIN) 5-325 MG tablet Take 1-2 tablets by mouth every 6 (six) hours as needed for moderate pain or severe pain.  . hydrocortisone cream 0.5 % Apply topically 3 (three) times daily.  . metFORMIN (GLUCOPHAGE) 1000 MG tablet Take 1,000 mg by mouth 2 (two) times daily with a meal.  . Methotrexate 25 MG/ML SOSY Inject 0.8 mLs into the skin every Monday.   Marland Kitchen PARoxetine (PAXIL) 40 MG tablet Take 1 tablet (40 mg total) by mouth daily.  . rivastigmine (EXELON) 1.5 MG capsule Take 1 capsule (1.5 mg total) by mouth 2 (two) times daily.  Marland Kitchen senna-docusate (SENOKOT-S) 8.6-50 MG tablet Take 1 tablet by mouth at bedtime.  . tamsulosin (FLOMAX) 0.4 MG CAPS capsule Take 0.4 mg by mouth.   No facility-administered encounter medications on file as of 08/22/2019.    PHYSICAL EXAM / ROS:   Current and past weights: 160 lbs reported in hospital chart General: NAD, frail appearing, thin Cardiovascular: no chest pain reported, no edema  Pulmonary: no cough, no increased SOB, room air Abdomen: appetite fair, endorses IBS-D incontinent of bowel at times GU: denies dysuria, continent of urine  MSK:  + joint and ROM abnormalities,  R hip fx, not set, ambulatory but has NWB ordered, Comminuted right acetabular fracture with associated fracture of the right inferior pubic ramus with osteopenia Skin: no rashes or wounds reported Neurological: Weakness, PD, cognitive impairment, endorses pain  Jason Coop, NP [facility]  COVID-19 PATIENT SCREENING TOOL  Person answering questions: ____________Staff_______ _____   1.  Is the patient or any family member in the home showing any signs or symptoms regarding respiratory  infection?               Person with Symptom- __________NA_________________  a. Fever                                                                          Yes___ No___          ___________________  b. Shortness of breath                                                    Yes___ No___          ___________________ c. Cough/congestion  Yes___  No___         ___________________ d. Body aches/pains                                                         Yes___ No___        ____________________ e. Gastrointestinal symptoms (diarrhea, nausea)           Yes___ No___        ____________________  2. Within the past 14 days, has anyone living in the home had any contact with someone with or under investigation for COVID-19?    Yes___ No_X_   Person __________________   

## 2019-08-30 ENCOUNTER — Telehealth: Payer: Self-pay | Admitting: Adult Health Nurse Practitioner

## 2019-08-30 NOTE — Telephone Encounter (Signed)
Rec'd message that wife had called and said that patient was having pain issues and requested a visit.  Called wife to schedule f/u visit, no answer - left message with my name and contact number.

## 2019-08-31 ENCOUNTER — Telehealth: Payer: Self-pay

## 2019-08-31 NOTE — Telephone Encounter (Signed)
Follow up call placed to patient's wife regarding pain management. [name] shared that patient did receive a refill on his pain medications but would like to discuss with NP further sx management. Visit scheduled for 09/09/19 @ 11:30am

## 2019-09-06 ENCOUNTER — Telehealth: Payer: Self-pay | Admitting: Adult Health Nurse Practitioner

## 2019-09-06 NOTE — Telephone Encounter (Signed)
Patient's wife had left message with other provider. Called wife about her concerns for her husband's anxiety/agitation.  Left VM with reason for call and call back info.  Do have appointment set up with Mr. Mccleland on Friday, 09/09/19. Rhyder Bratz K. Olena Heckle NP

## 2019-09-09 ENCOUNTER — Other Ambulatory Visit: Payer: Self-pay

## 2019-09-09 ENCOUNTER — Other Ambulatory Visit: Payer: Medicare HMO | Admitting: Adult Health Nurse Practitioner

## 2019-09-09 DIAGNOSIS — Z515 Encounter for palliative care: Secondary | ICD-10-CM

## 2019-09-09 DIAGNOSIS — G20 Parkinson's disease: Secondary | ICD-10-CM

## 2019-09-09 NOTE — Progress Notes (Signed)
Seth Mcpherson: [phone]  Fax: [phone]  PATIENT NAME: Seth Mcpherson DOB: 10-20-1945 MRN: [id number]  PRIMARY CARE PROVIDER:   Marygrace Drought, MD  REFERRING PROVIDER:  Juluis Pitch, MD [address] [place],  [place] [address]  RESPONSIBLE PARTY:   Seth Mcpherson, wife [phone]    RECOMMENDATIONS and PLAN:  1.  Advanced care planning. Patient is full code.    2.  Parkinson's disease.  Patient is followed by neurology.  Takes sinemet 25/100 mg QID.  States every now and then has freezing episodes.  Wife does state that he will have tremors in his hands when he gets tired at times.  Continue follow up and recommendations by neurology.   3. Dementia.  Patient has dementia related to his Parkinson's.  He is A&O x3 and has forgetfulness.   Patient has had increased agitation about a week ago and was able to reach his PCP office on call and was started on Ativan PRN and this increased his agitation.  Wife was able to reach his psychiatrist's office and get seroquel started.  Wife states that the seroquel has helped tremendously and not only has it helped with his agitation it has helped with his clarity.  PCP still wanting to get urine sample to check for possible UTI since the agitation came on quickly.  He is incontinent but states that he has noticed frequency and an odor.  Continue current dose of seroquel and follow up with UA and any treatment based on results.    4.  Functional status.  Patient had fall on 08/04/19 and was not a surgical candidate.  He was in SNF for rehab and came home last week and will be starting PT/OT through [facility].  He continues to be NWB until he is reevaluated by Dr. Roland Mcpherson on Monday.  Currently he is wheelchair bound.  Prior to the fall he was able to walk with walker. He requires assistance with ADLs.  His goal is to be able to walk with his walker again.  Work with PT/OT as  ordered  Palliative will continue to monitor for symptom management/decline and make recommendations as needed.  Have next appointment in 8 weeks.  Encouraged to call with any concerns.  I spent 90 minutes providing this consultation,  from 11:30 to 1:00 including time spent with patient/family, chart review, provider coordination, documentation. More than 50% of the time in this consultation was spent coordinating communication.   HISTORY OF PRESENT ILLNESS:  Seth Mcpherson is a 74 y.o. year old male with multiple medical problems including Parkinson's disease, cognitive impairment, fx right hip, no intervention, prostate cancer . Palliative Care was asked to help address goals of care.   CODE STATUS: full code  PPS: 30% HOSPICE ELIGIBILITY/DIAGNOSIS: TBD  PHYSICAL EXAM:  BP 118/62  HR 83  O2 98% on RA General: NAD, frail appearing, thin Cardiovascular: regular rate and rhythm Pulmonary:  Lung sounds clear; normal respiratory effort Abdomen: soft, nontender, + bowel sounds GU: no suprapubic tenderness Extremities: no edema, no joint deformities Skin: no rashes on exposed skin Neurological: Weakness; A&Ox3 has forgetfulness  PAST MEDICAL HISTORY:  Past Medical History:  Diagnosis Date  . Cancer ([place])   . Dementia ([place])   . Diabetes mellitus without complication ([place])   . Hypertension   . Parkinson disease ([place])   . Prostate cancer [facility])     SOCIAL HX:  Social History   Tobacco Use  .  Smoking status: Former Research scientist (life sciences)  . Smokeless tobacco: Never Used  Substance Use Topics  . Alcohol use: Not Currently    ALLERGIES: No Known Allergies   PERTINENT MEDICATIONS:  Outpatient Encounter Medications as of 09/09/2019  Medication Sig  . Armodafinil 200 MG TABS Take 200 mg by mouth daily.  Marland Kitchen aspirin EC 81 MG tablet Take 81 mg by mouth daily.  Marland Kitchen atorvastatin (LIPITOR) 40 MG tablet Take 40 mg by mouth daily.  . carbidopa-levodopa (SINEMET IR) 25-100 MG tablet Take 1 tablet by mouth 4  (four) times daily.   . folic acid (FOLVITE) 1 MG tablet Take 3 mg by mouth daily.  Marland Kitchen HYDROcodone-acetaminophen (NORCO/VICODIN) 5-325 MG tablet Take 1-2 tablets by mouth every 6 (six) hours as needed for moderate pain or severe pain.  . hydrocortisone cream 0.5 % Apply topically 3 (three) times daily.  . metFORMIN (GLUCOPHAGE) 1000 MG tablet Take 1,000 mg by mouth 2 (two) times daily with a meal.  . Methotrexate 25 MG/ML SOSY Inject 0.8 mLs into the skin every Monday.   Marland Kitchen PARoxetine (PAXIL) 40 MG tablet Take 1 tablet (40 mg total) by mouth daily.  . rivastigmine (EXELON) 1.5 MG capsule Take 1 capsule (1.5 mg total) by mouth 2 (two) times daily.  Marland Kitchen senna-docusate (SENOKOT-S) 8.6-50 MG tablet Take 1 tablet by mouth at bedtime.  . tamsulosin (FLOMAX) 0.4 MG CAPS capsule Take 0.4 mg by mouth.   No facility-administered encounter medications on file as of 09/09/2019.      Seth Mcpherson Jenetta Downer, NP

## 2019-10-14 ENCOUNTER — Telehealth: Payer: Self-pay | Admitting: Adult Health Nurse Practitioner

## 2019-10-14 NOTE — Telephone Encounter (Signed)
Returned wife's call to confirm appointment.  Confirmed appointment on July 29,2021 at 2pm Seth Mcpherson  K. Olena Heckle NP

## 2019-11-03 ENCOUNTER — Other Ambulatory Visit: Payer: Self-pay

## 2019-11-03 ENCOUNTER — Other Ambulatory Visit: Payer: Medicare HMO | Admitting: Adult Health Nurse Practitioner

## 2019-11-03 DIAGNOSIS — Z515 Encounter for palliative care: Secondary | ICD-10-CM

## 2019-11-03 DIAGNOSIS — G20 Parkinson's disease: Secondary | ICD-10-CM

## 2019-11-03 NOTE — Progress Notes (Signed)
[facility] Palliative Care Consult Note Telephone: [phone]  Fax: [phone]  PATIENT NAME: Seth Mcpherson DOB: [birth year] MRN: [id number]  PRIMARY CARE PROVIDER:   Marygrace Drought, MD  REFERRING PROVIDER:  Marygrace Drought, MD No address on file  RESPONSIBLE PARTY:   Seth Mcpherson, wife [phone]    RECOMMENDATIONS and PLAN:  1.  Advanced care planning.  Patient is DNR.  Had discussion today with patient and wife about advanced care planning.  Went over MOST form and filled out today.  Patient's medical wishes are for DNR, limited hospital interventions, antibiotics and IV fluids as indicated, feeding tube for a trial period.  Uploaded MOST and DNR to [facility] and left originals and home with patient.  2.  Functional status.  Patient requires assistance with standing.  Is able at times to help with transfers.  Mostly uses wheelchair to get around.  Requires total care with ADLs.  Patient's physical therapy is to resume after he receives lift shoes and orthotics for leg length inequality.  Patient is able to bear weight on the right leg now after right hip fracture without surgical repair in May of this year.  Continue PT as ordered  3.  Parkinson's disease.  Patient is followed by neurology.  Takes Sinemet 25/100 mg 4 times daily.  Will have freezing episodes especially when trying to transfer.  Continue follow-up and recommendations by neurology  4.  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  Patient is alert and oriented x3 but has forgetfulness.  Patient is able to hold conversation and is able to make his needs known.  Going over advanced care planning today patient was able to understand the conversation and make his medical wishes known.  Patient is currently on Paxil 40 mg daily, Exelon 1-1/2 mg twice daily, and Seroquel 50 mg in the morning and 75 mg at night.  Patient is doing well on these medications.  Wife does not report any episodes of agitation since  last visit.  The patient was seen by psychiatry on 10/31/2019 and his nuvigil was stopped and Wellbutrin started.  Wife stated that he was having some agitation yesterday related to this switch but is better today.  Did state that she did give him a quarter dose of his nuvigil today.  Patient's appetite is good with no weight loss reported.  Continue supportive care at home.  5.  Support.  Wife does state that family members want her to get extra help in the home.  She and patient been a little resistant to bringing others into the home but have tried a few agencies.  Have encouraged them to think about bringing extra help in the home when they are ready.  Gave him a list of resources of agencies for caregivers in the home.  Palliative will continue to monitor for symptom management/decline and make recommendations as needed.  Next appointment in 8 weeks.  Encouraged to call with any questions or concerns.  I spent 120 minutes providing this consultation,  from 2:00 to 4:00 including time spent with patient/family, chart review, provider coordination, documentation. More than 50% of the time in this consultation was spent coordinating communication.   HISTORY OF PRESENT ILLNESS:  Seth Mcpherson is a 74 y.o. year old male with multiple medical problems including Parkinson's disease, cognitive impairment, fx right hip with no intervention, prostate cancer. Palliative Care was asked to help address goals of care.   CODE STATUS: DNR  PPS: 30% HOSPICE ELIGIBILITY/DIAGNOSIS: TBD  PHYSICAL  EXAM:   General: NAD, frail appearing, thin Extremities: no edema Skin: no rashes on exposed skin Neurological: Weakness; A&O to person and place  PAST MEDICAL HISTORY:  Past Medical History:  Diagnosis Date  . Cancer ([place])   . Dementia ([place])   . Diabetes mellitus without complication ([place])   . Hypertension   . Parkinson disease ([place])   . Prostate cancer [facility])     SOCIAL HX:  Social History   Tobacco Use   . Smoking status: Former Research scientist (life sciences)  . Smokeless tobacco: Never Used  Substance Use Topics  . Alcohol use: Not Currently    ALLERGIES: No Known Allergies   PERTINENT MEDICATIONS:  Outpatient Encounter Medications as of 11/03/2019  Medication Sig  . Armodafinil 200 MG TABS Take 200 mg by mouth daily.  Marland Kitchen aspirin EC 81 MG tablet Take 81 mg by mouth daily.  Marland Kitchen atorvastatin (LIPITOR) 40 MG tablet Take 40 mg by mouth daily.  . carbidopa-levodopa (SINEMET IR) 25-100 MG tablet Take 1 tablet by mouth 4 (four) times daily.   . folic acid (FOLVITE) 1 MG tablet Take 3 mg by mouth daily.  Marland Kitchen HYDROcodone-acetaminophen (NORCO/VICODIN) 5-325 MG tablet Take 1-2 tablets by mouth every 6 (six) hours as needed for moderate pain or severe pain.  . hydrocortisone cream 0.5 % Apply topically 3 (three) times daily.  . metFORMIN (GLUCOPHAGE) 1000 MG tablet Take 1,000 mg by mouth 2 (two) times daily with a meal.  . Methotrexate 25 MG/ML SOSY Inject 0.8 mLs into the skin every Monday.   Marland Kitchen PARoxetine (PAXIL) 40 MG tablet Take 1 tablet (40 mg total) by mouth daily.  . rivastigmine (EXELON) 1.5 MG capsule Take 1 capsule (1.5 mg total) by mouth 2 (two) times daily.  Marland Kitchen senna-docusate (SENOKOT-S) 8.6-50 MG tablet Take 1 tablet by mouth at bedtime.  . tamsulosin (FLOMAX) 0.4 MG CAPS capsule Take 0.4 mg by mouth.   No facility-administered encounter medications on file as of 11/03/2019.      Seth Mcpherson Jenetta Downer, NP

## 2019-11-06 ENCOUNTER — Inpatient Hospital Stay
Admission: EM | Admit: 2019-11-06 | Discharge: 2019-11-08 | DRG: 871 | Disposition: A | Discharge status: Home Health | Payer: Medicare HMO | Attending: Internal Medicine | Admitting: Internal Medicine

## 2019-11-06 ENCOUNTER — Other Ambulatory Visit: Payer: Self-pay

## 2019-11-06 ENCOUNTER — Emergency Department: Payer: Medicare HMO

## 2019-11-06 DIAGNOSIS — A419 Sepsis, unspecified organism: Principal | ICD-10-CM

## 2019-11-06 DIAGNOSIS — E119 Type 2 diabetes mellitus without complications: Secondary | ICD-10-CM | POA: Diagnosis present

## 2019-11-06 DIAGNOSIS — I10 Essential (primary) hypertension: Secondary | ICD-10-CM | POA: Diagnosis present

## 2019-11-06 DIAGNOSIS — Z7984 Long term (current) use of oral hypoglycemic drugs: Secondary | ICD-10-CM

## 2019-11-06 DIAGNOSIS — F0280 Dementia in other diseases classified elsewhere without behavioral disturbance: Secondary | ICD-10-CM | POA: Diagnosis present

## 2019-11-06 DIAGNOSIS — J189 Pneumonia, unspecified organism: Secondary | ICD-10-CM | POA: Diagnosis present

## 2019-11-06 DIAGNOSIS — Z79899 Other long term (current) drug therapy: Secondary | ICD-10-CM | POA: Diagnosis not present

## 2019-11-06 DIAGNOSIS — Z20822 Contact with and (suspected) exposure to covid-19: Secondary | ICD-10-CM | POA: Diagnosis present

## 2019-11-06 DIAGNOSIS — R6520 Severe sepsis without septic shock: Secondary | ICD-10-CM

## 2019-11-06 DIAGNOSIS — G9349 Other encephalopathy: Secondary | ICD-10-CM | POA: Diagnosis present

## 2019-11-06 DIAGNOSIS — N390 Urinary tract infection, site not specified: Secondary | ICD-10-CM | POA: Diagnosis present

## 2019-11-06 DIAGNOSIS — Z8546 Personal history of malignant neoplasm of prostate: Secondary | ICD-10-CM | POA: Diagnosis not present

## 2019-11-06 DIAGNOSIS — A4189 Other specified sepsis: Principal | ICD-10-CM | POA: Diagnosis present

## 2019-11-06 DIAGNOSIS — G20 Parkinson's disease: Secondary | ICD-10-CM | POA: Diagnosis present

## 2019-11-06 DIAGNOSIS — Z87891 Personal history of nicotine dependence: Secondary | ICD-10-CM

## 2019-11-06 DIAGNOSIS — R4189 Other symptoms and signs involving cognitive functions and awareness: Secondary | ICD-10-CM | POA: Diagnosis present

## 2019-11-06 DIAGNOSIS — Z66 Do not resuscitate: Secondary | ICD-10-CM | POA: Diagnosis present

## 2019-11-06 DIAGNOSIS — B964 Proteus (mirabilis) (morganii) as the cause of diseases classified elsewhere: Secondary | ICD-10-CM | POA: Diagnosis present

## 2019-11-06 DIAGNOSIS — Z7982 Long term (current) use of aspirin: Secondary | ICD-10-CM | POA: Diagnosis not present

## 2019-11-06 LAB — URINALYSIS, COMPLETE (UACMP) WITH MICROSCOPIC
Bilirubin Urine: NEGATIVE
Glucose, UA: NEGATIVE mg/dL
Ketones, ur: NEGATIVE mg/dL
Nitrite: POSITIVE — AB
Protein, ur: 100 mg/dL — AB
Specific Gravity, Urine: 1.025 (ref 1.005–1.030)
WBC, UA: >50 WBC/hpf — ABNORMAL HIGH (ref 0–5)
pH: 8 (ref 5.0–8.0)

## 2019-11-06 LAB — CBC WITH DIFFERENTIAL/PLATELET
Abs Immature Granulocytes: 0.03 10*3/uL (ref 0.00–0.07)
Basophils Absolute: 0.1 10*3/uL (ref 0.0–0.1)
Basophils Relative: 1 %
Eosinophils Absolute: 0.1 10*3/uL (ref 0.0–0.5)
Eosinophils Relative: 1 %
HCT: 41.1 % (ref 39.0–52.0)
Hemoglobin: 13 g/dL (ref 13.0–17.0)
Immature Granulocytes: 0 %
Lymphocytes Relative: 7 %
Lymphs Abs: 0.7 10*3/uL (ref 0.7–4.0)
MCH: 28.5 pg (ref 26.0–34.0)
MCHC: 31.6 g/dL (ref 30.0–36.0)
MCV: 90.1 fL (ref 80.0–100.0)
Monocytes Absolute: 1.1 10*3/uL — ABNORMAL HIGH (ref 0.1–1.0)
Monocytes Relative: 11 %
Neutro Abs: 8.1 10*3/uL — ABNORMAL HIGH (ref 1.7–7.7)
Neutrophils Relative %: 80 %
Platelets: 229 10*3/uL (ref 150–400)
RBC: 4.56 MIL/uL (ref 4.22–5.81)
RDW: 15 % (ref 11.5–15.5)
WBC: 10.1 10*3/uL (ref 4.0–10.5)
nRBC: 0 % (ref 0.0–0.2)

## 2019-11-06 LAB — LACTIC ACID, PLASMA
Lactic Acid, Venous: 2.8 mmol/L (ref 0.5–1.9)
Lactic Acid, Venous: 2.6 mmol/L (ref 0.5–1.9)
Lactic Acid, Venous: 1.1 mmol/L (ref 0.5–1.9)

## 2019-11-06 LAB — COMPREHENSIVE METABOLIC PANEL
ALT: 7 U/L (ref 0–44)
AST: 20 U/L (ref 15–41)
Albumin: 3.8 g/dL (ref 3.5–5.0)
Alkaline Phosphatase: 122 U/L (ref 38–126)
Anion gap: 9 (ref 5–15)
BUN: 23 mg/dL (ref 8–23)
CO2: 28 mmol/L (ref 22–32)
Calcium: 8.9 mg/dL (ref 8.9–10.3)
Chloride: 97 mmol/L — ABNORMAL LOW (ref 98–111)
Creatinine, Ser: 0.73 mg/dL (ref 0.61–1.24)
GFR calc Af Amer: >60 mL/min (ref >60)
GFR calc non Af Amer: >60 mL/min (ref >60)
Glucose, Bld: 121 mg/dL — ABNORMAL HIGH (ref 70–99)
Potassium: 4.2 mmol/L (ref 3.5–5.1)
Sodium: 134 mmol/L — ABNORMAL LOW (ref 135–145)
Total Bilirubin: 0.8 mg/dL (ref 0.3–1.2)
Total Protein: 6.8 g/dL (ref 6.5–8.1)

## 2019-11-06 LAB — SARS CORONAVIRUS 2 BY RT PCR (HOSPITAL ORDER, PERFORMED IN ~~LOC~~ HOSPITAL LAB): SARS Coronavirus 2: NEGATIVE

## 2019-11-06 MED ORDER — SODIUM CHLORIDE 0.9 % IV SOLN
1.0000 g | Freq: Once | INTRAVENOUS | Status: AC
  Administered 2019-11-06: 1 g via INTRAVENOUS
  Filled 2019-11-06: qty 10

## 2019-11-06 MED ORDER — LACTATED RINGERS IV SOLN: INTRAVENOUS | Status: DC

## 2019-11-06 MED ORDER — SODIUM CHLORIDE 0.9 % IV SOLN
500.0000 mg | Freq: Once | INTRAVENOUS | Status: AC
  Administered 2019-11-06: 500 mg via INTRAVENOUS
  Filled 2019-11-06: qty 500

## 2019-11-06 NOTE — ED Notes (Signed)
Pt resting, spouse at bedside.

## 2019-11-06 NOTE — ED Notes (Signed)
Report from kate, rn.  

## 2019-11-06 NOTE — ED Provider Notes (Signed)
[facility] Emergency Department Provider Note   ____________________________________________    I have reviewed the triage vital signs and the nursing notes.   HISTORY  Chief Complaint Altered Mental Status  History limited by altered mental status   HPI Seth Mcpherson is a 74 y.o. male with history of diabetes, hypertension, Parkinson's, dementia brought in for altered mental status, increased weakness.  Reportedly patient's family is concerned that he may have a urinary tract infection given foul-smelling urine.  He has been more confused than typical today reportedly.  Past Medical History:  Diagnosis Date  . Cancer ([place])   . Dementia ([place])   . Diabetes mellitus without complication ([place])   . Hypertension   . Parkinson disease ([place])   . Prostate cancer [facility])     Patient Active Problem List   Diagnosis Date Noted  . Chronic diarrhea 08/05/2019  . Closed right acetabular fracture ([place]) 08/05/2019  . Type 2 diabetes mellitus without complication ([place]) 91/47/8295  . Essential hypertension 08/05/2019  . Parkinson disease ([place]) 08/05/2019  . 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 Syncope 08/05/2019  . Hypotension 08/05/2019  . AKI (acute kidney injury) ([place]) 08/05/2019  . Acetabular fracture ([place]) 08/05/2019    History reviewed. No pertinent surgical history.  Prior to Admission medications   Medication Sig Start Date End Date Taking? Authorizing Provider  Armodafinil 200 MG TABS Take 200 mg by mouth daily. 06/28/19   [provider]  aspirin EC 81 MG tablet Take 81 mg by mouth daily.    [provider]  atorvastatin (LIPITOR) 40 MG tablet Take 40 mg by mouth daily.    [provider]  carbidopa-levodopa (SINEMET IR) 25-100 MG tablet Take 1 tablet by mouth 4 (four) times daily.     [provider]  folic acid (FOLVITE) 1 MG tablet Take 3 mg by mouth daily.    [provider]  HYDROcodone-acetaminophen (NORCO/VICODIN) 5-325 MG tablet Take 1-2 tablets by mouth every 6 (six) hours as needed for moderate pain or severe pain. 08/15/19   Swayze, Ava, DO  hydrocortisone cream 0.5 % Apply topically 3 (three) times daily. 08/15/19   Swayze, Ava, DO  metFORMIN (GLUCOPHAGE) 1000 MG tablet Take 1,000 mg by mouth 2 (two) times daily with a meal.    [provider]  Methotrexate 25 MG/ML SOSY Inject 0.8 mLs into the skin every Monday.     [provider]  PARoxetine (PAXIL) 40 MG tablet Take 1 tablet (40 mg total) by mouth daily. 08/16/19   Swayze, Ava, DO  rivastigmine (EXELON) 1.5 MG capsule Take 1 capsule (1.5 mg total) by mouth 2 (two) times daily. 08/15/19   Swayze, Ava, DO  senna-docusate (SENOKOT-S) 8.6-50 MG tablet Take 1 tablet by mouth at bedtime. 08/15/19   Swayze, Ava, DO  tamsulosin (FLOMAX) 0.4 MG CAPS capsule Take 0.4 mg by mouth.    [provider]     Allergies Patient has no known allergies.  History reviewed. No pertinent family history.  Social History Social History   Tobacco Use  . Smoking status: Former Research scientist (life sciences)  . Smokeless tobacco: Never Used  Substance Use Topics  . Alcohol use: Not Currently  . Drug use: Not on file    Review of Systems limited by altered mental status  Constitutional: Denies chills  Cardiovascular: Denies chest pain. Respiratory: Positive cough Gastrointestinal: No abdominal pain.   Genitourinary: Denies dysuria Musculoskeletal: Negative for back pain.  Neurological: Negative for headaches    ____________________________________________   PHYSICAL EXAM:  VITAL SIGNS: ED Triage Vitals  Enc Vitals Group     BP 11/06/19 1536 115/71     Pulse Rate 11/06/19 1536 93     Resp 11/06/19 1536 20     Temp 11/06/19 1536 98.3 F (36.8 C)     Temp Source 11/06/19 1536 Oral     SpO2 11/06/19 1536 99 %     Weight 11/06/19 1538 72.4 kg (159 lb 9.8 oz)     Height 11/06/19 1538 1.854 m  (6\' 1" )     Head Circumference --      Peak Flow --      Pain Score 11/06/19 1538 0     Pain Loc --      Pain Edu? --      Excl. in [place]? --     Constitutional: Alert,  Head: Atraumatic Nose: No congestion/rhinnorhea. Mouth/Throat: Mucous membranes are moist.   Neck:  Painless ROM, no vertebral tenderness palpation Cardiovascular: Normal rate, regular rhythm.  Good peripheral circulation.  No chest wall tenderness palpation Respiratory: Normal respiratory effort.  No retractions. Lungs CTAB. Gastrointestinal: Soft and nontender. No distention.  No CVA tenderness.  Reassuring exam  Musculoskeletal:  Warm and well perfused Neurologic:  No gross focal neurologic deficits are appreciated.  Skin:  Skin is warm, dry and intact. No rash noted. Psychiatric: Mood and affect are normal. Speech and behavior are normal.  ____________________________________________   LABS (all labs ordered are listed, but only abnormal results are displayed)  Labs Reviewed  LACTIC ACID, PLASMA - Abnormal; Notable for the following components:      Result Value   Lactic Acid, Venous 2.8 (*)    All other components within normal limits  CBC WITH DIFFERENTIAL/PLATELET - Abnormal; Notable for the following components:   Neutro Abs 8.1 (*)    Monocytes Absolute 1.1 (*)    All other components within normal limits  URINALYSIS, COMPLETE (UACMP) WITH MICROSCOPIC - Abnormal; Notable for the following components:   Color, Urine YELLOW (*)    APPearance TURBID (*)    Hgb urine dipstick SMALL (*)    Protein, ur 100 (*)    Nitrite POSITIVE (*)    Leukocytes,Ua MODERATE (*)    WBC, UA >50 (*)    Bacteria, UA FEW (*)    All other components within normal limits  COMPREHENSIVE METABOLIC PANEL - Abnormal; Notable for the following components:   Sodium 134 (*)    Chloride 97 (*)    Glucose, Bld 121 (*)    All other components within normal limits  URINE CULTURE  SARS CORONAVIRUS 2 BY RT PCR (HOSPITAL ORDER,  [facility])  CULTURE, BLOOD (ROUTINE X 2)  CULTURE, BLOOD (ROUTINE X 2)  LACTIC ACID, PLASMA   ____________________________________________  EKG  ED ECG REPORT I, Lavonia Drafts, the attending physician, personally viewed and interpreted this ECG.  Date: 11/06/2019  Rhythm: normal sinus rhythm QRS Axis: normal Intervals: normal ST/T Wave abnormalities: normal Narrative Interpretation: no evidence of acute ischemia  ____________________________________________  RADIOLOGY  Chest x-ray bibasilar opacities, reviewed by me ____________________________________________   PROCEDURES  Procedure(s) performed: No  Procedures   Critical Care performed: yes  CRITICAL CARE Performed by: Lavonia Drafts   Total critical care time: 30 minutes  Critical care time was exclusive of separately billable procedures and treating other patients.  Critical care was necessary to treat or prevent imminent or  life-threatening deterioration.  Critical care was time spent personally by me on the following activities: development of treatment plan with patient and/or surrogate as well as nursing, discussions with consultants, evaluation of patient's response to treatment, examination of patient, obtaining history from patient or surrogate, ordering and performing treatments and interventions, ordering and review of laboratory studies, ordering and review of radiographic studies, pulse oximetry and re-evaluation of patient's condition.  ____________________________________________   INITIAL IMPRESSION / ASSESSMENT AND PLAN / ED COURSE  Pertinent labs & imaging results that were available during my care of the patient were reviewed by me and considered in my medical decision making (see chart for details).  Patient presents with worsened altered mental status/confusion in setting of dementia.  Vital signs are overall reassuring, mild tachypnea.  Differential includes  pneumonia, urinary tract infection, abdominal exam is reassuring.  Lab work notable for elevated lactic acid.  Chest x-ray demonstrates bibasilar opacities, suspicious for pneumonia.  Will treat with Rocephin and azithromycin  Urinalysis also demonstrates likely urinary tract infection, this will be covered by the Rocephin as well.  Have discussed with the hospitalist service for admission    ____________________________________________   FINAL CLINICAL IMPRESSION(S) / ED DIAGNOSES  Final diagnoses:  Sepsis with encephalopathy without septic shock, due to unspecified organism [facility])  Lower urinary tract infectious disease        Note:  This document was prepared using Dragon voice recognition software and may include unintentional dictation errors.   Lavonia Drafts, MD 11/06/19 [phone]

## 2019-11-06 NOTE — ED Notes (Signed)
Spouse and pt updated on bed assignment.

## 2019-11-06 NOTE — ED Notes (Signed)
This RN redrew light green and sent it to lab at this time.

## 2019-11-06 NOTE — Progress Notes (Addendum)
Notified provider and bedside nurse of need to order blood cultures.  Md will order, Beside RN states the cultures were drawn when patient arrived prior to starting abx. She charted these in a note to ensure sepsis protocol was correctly carried out.

## 2019-11-06 NOTE — H&P (Signed)
History and Physical   Sabri Teal GYI:[id number] DOB: 1946/03/30 DOA: 11/06/2019  Referring MD/NP/PA: Dr. Lavonia Drafts  PCP: Marygrace Drought, MD   Outpatient Specialists: None  Patient coming from: Home  Chief Complaint: Altered mental status  HPI: Seth Mcpherson is a 74 y.o. 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  Wife at bedside reported that patient has shown weakness.  She is a Marine scientist and was suspecting UTI or sepsis.  He has had 1 before.  Patient is awake and communicating.  He has been weak debilitated and more confused.  He has had low-grade temperature but no nausea or vomiting.  He has had cough with no significant shortness of breath.  The cough was said to be nonproductive.  In the ER patient was noted to have pneumonia and UTI.  He also met sepsis criteria based on his pulse rate and respiratory rate.  Is being admitted for further evaluation and treatment..  ED Course: Temperature 98.3 blood pressure 130/73 pulse 93 respirate 24 oxygen sats 96% on room air.  White count is 10.1 hemoglobin 13.1 platelet count of 229 sodium 134 potassium 4.2 chloride 97 rest of chemistry appeared to be within normal.  Lactic acid 2.8.  Urinalysis showed turbid urine with positive nitrite moderate leukocytes WBC more than 50 with few bacteria.  Chest x-ray showed increasing peribronchial thickening with lower lung volumes with patchy bibasilar opacities reflecting possible pneumonia.  Patient initiated on Rocephin and Zithromax and being admitted for further evaluation.  Review of Systems: As per HPI otherwise 10 point review of systems negative.    Past Medical History:  Diagnosis Date  . Cancer ([place])   . Dementia ([place])   . Diabetes mellitus without complication ([place])   . Hypertension   . Parkinson disease  ([place])   . Prostate cancer [facility])     History reviewed. No pertinent surgical history.   reports that he has quit smoking. He has never used smokeless tobacco. He reports previous alcohol use. No history on file for drug use.  No Known Allergies  History reviewed. No pertinent family history.   Prior to Admission medications   Medication Sig Start Date End Date Taking? Authorizing Provider  Armodafinil 200 MG TABS Take 50 mg by mouth daily.  06/28/19  Yes [provider]  aspirin EC 81 MG tablet Take 81 mg by mouth daily.   Yes [provider]  atorvastatin (LIPITOR) 40 MG tablet Take 40 mg by mouth daily.   Yes [provider]  buPROPion (WELLBUTRIN XL) 150 MG 24 hr tablet Take 150 mg by mouth daily. 10/31/19  Yes [provider]  folic acid (FOLVITE) 1 MG tablet Take 3 mg by mouth daily.   Yes [provider]  metFORMIN (GLUCOPHAGE) 1000 MG tablet Take 1,000 mg by mouth 2 (two) times daily with a meal.   Yes [provider]  Methotrexate 25 MG/ML SOSY Inject 0.8 mLs into the skin every Friday.    Yes [provider]  PARoxetine (PAXIL) 40 MG tablet Take 1 tablet (40 mg total) by mouth daily. 08/16/19  Yes Swayze, Ava, DO  QUEtiapine (SEROQUEL) 25 MG tablet Take 50-75 mg by mouth See admin instructions. Take 2 tablets ('50mg'$ ) by mouth every morning and take 3 tablets ('75mg'$ ) by mouth every night 09/26/19  Yes [provider]  rivastigmine (EXELON) 1.5 MG capsule Take 1 capsule (1.5 mg total) by mouth 2 (two) times daily. 08/15/19  Yes Swayze, Ava, DO  tamsulosin (FLOMAX) 0.4 MG CAPS capsule Take 0.4 mg by mouth daily.    Yes [provider]    Physical Exam: Vitals:   11/06/19 1820 11/06/19 1824 11/06/19 1900 11/06/19 1930  BP: (!) 111/90  (!) 130/73 125/72  Pulse:  67 87 87  Resp:   20   Temp:      TempSrc:      SpO2:  99% 96% 97%  Weight:      Height:          Constitutional: Drowsy but communicating  and in no distress Vitals:   11/06/19 1820 11/06/19 1824 11/06/19 1900 11/06/19 1930  BP: (!) 111/90  (!) 130/73 125/72  Pulse:  67 87 87  Resp:   20   Temp:      TempSrc:      SpO2:  99% 96% 97%  Weight:      Height:       Eyes: PERRL, lids and conjunctivae normal ENMT: Mucous membranes are dry. Posterior pharynx clear of any exudate or lesions.Normal dentition.  Neck: normal, supple, no masses, no thyromegaly Respiratory: Coarse breath sounds with some mild rhonchi bilaterally, no wheezing, no crackles. Normal respiratory effort. No accessory muscle use.  Cardiovascular: Regular rate and rhythm, no murmurs / rubs / gallops. No extremity edema. 2+ pedal pulses. No carotid bruits.  Abdomen: no tenderness, no masses palpated. No hepatosplenomegaly. Bowel sounds positive.  Musculoskeletal: no clubbing / cyanosis. No joint deformity upper and lower extremities. Good ROM, no contractures. Normal muscle tone.  Skin: no rashes, lesions, ulcers. No induration Neurologic: CN 2-12 grossly intact. Sensation intact, DTR normal. Strength 5/5 in all 4.  Psychiatric: Drowsy, arousable and communicating.     Labs on Admission: I have personally reviewed following labs and imaging studies  CBC: Recent Labs  Lab 11/06/19 1557  WBC 10.1  NEUTROABS 8.1*  HGB 13.0  HCT 41.1  MCV 90.1  PLT 568   Basic Metabolic Panel: Recent Labs  Lab 11/06/19 1617  NA 134*  K 4.2  CL 97*  CO2 28  GLUCOSE 121*  BUN 23  CREATININE 0.73  CALCIUM 8.9   GFR: Estimated Creatinine Clearance: 81.7 mL/min (by C-G formula based on SCr of 0.73 mg/dL). Liver Function Tests: Recent Labs  Lab 11/06/19 1617  AST 20  ALT 7  ALKPHOS 122  BILITOT 0.8  PROT 6.8  ALBUMIN 3.8   No results for input(s): LIPASE, AMYLASE in the last 168 hours. No results for input(s): AMMONIA in the last 168 hours. Coagulation Profile: No results for input(s): INR, PROTIME in the last 168 hours. Cardiac Enzymes: No results  for input(s): CKTOTAL, CKMB, CKMBINDEX, TROPONINI in the last 168 hours. BNP (last 3 results) No results for input(s): PROBNP in the last 8760 hours. HbA1C: No results for input(s): HGBA1C in the last 72 hours. CBG: No results for input(s): GLUCAP in the last 168 hours. Lipid Profile: No results for input(s): CHOL, HDL, LDLCALC, TRIG, CHOLHDL, LDLDIRECT in the last 72 hours. Thyroid Function Tests: No results for input(s): TSH, T4TOTAL, FREET4, T3FREE, THYROIDAB in the last 72 hours. Anemia Panel: No results for input(s): VITAMINB12, FOLATE, FERRITIN, TIBC, IRON, RETICCTPCT in the last 72 hours. Urine analysis:    Component Value Date/Time   COLORURINE YELLOW (A) 11/06/2019 1745   APPEARANCEUR TURBID (A) 11/06/2019 1745   LABSPEC  1.025 11/06/2019 1745   PHURINE 8.0 11/06/2019 1745   GLUCOSEU NEGATIVE 11/06/2019 1745   HGBUR SMALL (A) 11/06/2019 1745   BILIRUBINUR NEGATIVE 11/06/2019 1745   KETONESUR NEGATIVE 11/06/2019 1745   PROTEINUR 100 (A) 11/06/2019 1745   NITRITE POSITIVE (A) 11/06/2019 1745   LEUKOCYTESUR MODERATE (A) 11/06/2019 1745   Sepsis Labs: '@LABRCNTIP'$ (procalcitonin:4,lacticidven:4) ) Recent Results (from the past 240 hour(s))  SARS Coronavirus 2 by RT PCR (hospital order, performed in [place] hospital lab) Nasopharyngeal Nasopharyngeal Swab     Status: None   Collection Time: 11/06/19  5:45 PM   Specimen: Nasopharyngeal Swab  Result Value Ref Range Status   SARS Coronavirus 2 NEGATIVE NEGATIVE Final    Comment: (NOTE) SARS-CoV-2 target nucleic acids are NOT DETECTED.  The SARS-CoV-2 RNA is generally detectable in upper and lower respiratory specimens during the acute phase of infection. The lowest concentration of SARS-CoV-2 viral copies this assay can detect is 250 copies / mL. A negative result does not preclude SARS-CoV-2 infection and should not be used as the sole basis for treatment or other patient management decisions.  A negative result may  occur with improper specimen collection / handling, submission of specimen other than nasopharyngeal swab, presence of viral mutation(s) within the areas targeted by this assay, and inadequate number of viral copies (<250 copies / mL). A negative result must be combined with clinical observations, patient history, and epidemiological information.  Fact Sheet for Patients:   StrictlyIdeas.no  Fact Sheet for Healthcare Providers: BankingDealers.co.za  This test is not yet approved or  cleared by the [place] FDA and has been authorized for detection and/or diagnosis of SARS-CoV-2 by FDA under an Emergency Use Authorization (EUA).  This EUA will remain in effect (meaning this test can be used) for the duration of the COVID-19 declaration under Section 564(b)(1) of the Act, 21 U.S.C. section 360bbb-3(b)(1), unless the authorization is terminated or revoked sooner.  Performed at [facility], [address] [place], [place] [address]      Radiological Exams on Admission: DG Chest [facility] 1 View  Result Date: 11/06/2019 CLINICAL DATA:  Questionable sepsis. EXAM: PORTABLE CHEST 1 VIEW COMPARISON:  Radiograph 08/10/2019 FINDINGS: Mild rotation. Chronic but slightly progressive rightward mediastinal shift from prior exam, likely accentuated by rotation. There is increasing peribronchial thickening with lower lung volumes. Minimal patchy opacity at both lung bases. Stable heart size and mediastinal contours. No pneumothorax or large pleural effusion. Mild gaseous distention of colon in the upper abdomen. IMPRESSION: 1. Increasing peribronchial thickening with lower lung volumes. Patchy bibasilar opacities may be atelectasis or pneumonia. 2. Chronic but slightly progressive rightward mediastinal shift from prior exam, likely accentuated by rotation. Electronically Signed   By: Keith Rake M.D.   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  Currently getting fluids.  He just meets the SIRS criteria with evidence of infection.  IV Rocephin and Zithromax to treat both UTI and pneumonia.  Follow urine and blood cultures.  #2 community-acquired pneumonia: Continue with Rocephin and Zithromax as per above.  #3 UTI: Again follow urine culture results.  Empirically on Rocephin.  #4 altered mental  status: Patient has mild dementia now worsened.  Most likely as a result of sepsis.  Patient already communicating.  Continue treating underlying cause.  #5 Parkinson's disease: We will continue with home regimen including Mysoline.  #6 early dementia: Seems to be at baseline.  No change in therapy  #7 diabetes: Sliding scale insulin.  Hold Metformin.  #8 essential hypertension: Continue with home regimen and monitor blood pressure.   DVT prophylaxis: Lovenox Code Status: Full code Family Communication: Wife at bedside Disposition Plan: Home Consults called: None Admission status: Inpatient  Severity of Illness: The appropriate patient status for this patient is INPATIENT. Inpatient status is judged to be reasonable and necessary in order to provide the required intensity of service to ensure the patient's safety. The patient's presenting symptoms, physical exam findings, and initial radiographic and laboratory data in the context of their chronic comorbidities is felt to place them at high risk for further clinical deterioration. Furthermore, it is not anticipated that the patient will be medically stable for discharge from the hospital within 2 midnights of admission. The following factors support  the patient status of inpatient.   " The patient's presenting symptoms include altered mental status. " The worrisome physical exam findings include mild confusion. " The initial radiographic and laboratory data are worrisome because of evidence of pneumonia and UTI. " The chronic co-morbidities include diabetes with Parkinson's disease.   * I certify that at the point of admission it is my clinical judgment that the patient will require inpatient hospital care spanning beyond 2 midnights from the point of admission due to high intensity of service, high risk for further deterioration and high frequency of surveillance required.Barbette Merino MD [facility] Pager [phone]  If 7PM-7AM, please contact night-coverage [email or web address] Password [facility]  11/06/2019, 8:17 PM

## 2019-11-06 NOTE — Progress Notes (Signed)
CODE SEPSIS - PHARMACY COMMUNICATION  **Broad Spectrum Antibiotics should be administered within 1 hour of Sepsis diagnosis**  Time Code Sepsis Called/Page Received: 8/1 @ 1706  Antibiotics Ordered: Ceftriaxone 1 gm   Time of 1st antibiotic administration: 8/1 @ 1551  Additional action taken by pharmacy:   If necessary, Name of Provider/Nurse Contacted:     Yaelis Scharfenberg D ,PharmD Clinical Pharmacist  11/06/2019  5:46 PM

## 2019-11-06 NOTE — ED Notes (Signed)
One set of cultures obtained from L FA. Second set of cultures obtained from L hand.

## 2019-11-06 NOTE — ED Triage Notes (Signed)
Pt arrives [facility] from home for AMS. EMS reports being able to answer all questions. Hx parkinsons and dementia. Wife thought maybe pt had UTI, states urine was discolored and had smell. Hip fx few months ago, no surgery performed. Stand and pivot only, hasn't walked. Arrives with DNR. Pt alert to self but confused otherwise.

## 2019-11-06 NOTE — ED Notes (Signed)
Date and time results received: 11/06/19 1626   Test: lactic acid Critical Value: 2.8  Name of Provider Notified: Dr. Corky Downs

## 2019-11-06 NOTE — ED Notes (Signed)
Repeat lactic acid drawn and sent to lab. Patient is resting quietly with family member at bedside. Patient denies need for anything at this time.

## 2019-11-06 NOTE — ED Notes (Signed)
Towels and fidget vest provided to encourage pt compliance to keep monitoring equipment in place.

## 2019-11-07 LAB — CBC WITH DIFFERENTIAL/PLATELET
Abs Immature Granulocytes: 0.04 10*3/uL (ref 0.00–0.07)
Basophils Absolute: 0.1 10*3/uL (ref 0.0–0.1)
Basophils Relative: 1 %
Eosinophils Absolute: 0.5 10*3/uL (ref 0.0–0.5)
Eosinophils Relative: 5 %
HCT: 33.6 % — ABNORMAL LOW (ref 39.0–52.0)
Hemoglobin: 11.3 g/dL — ABNORMAL LOW (ref 13.0–17.0)
Immature Granulocytes: 0 %
Lymphocytes Relative: 11 %
Lymphs Abs: 1 10*3/uL (ref 0.7–4.0)
MCH: 29.2 pg (ref 26.0–34.0)
MCHC: 33.6 g/dL (ref 30.0–36.0)
MCV: 86.8 fL (ref 80.0–100.0)
Monocytes Absolute: 0.9 10*3/uL (ref 0.1–1.0)
Monocytes Relative: 10 %
Neutro Abs: 6.6 10*3/uL (ref 1.7–7.7)
Neutrophils Relative %: 73 %
Platelets: 231 10*3/uL (ref 150–400)
RBC: 3.87 MIL/uL — ABNORMAL LOW (ref 4.22–5.81)
RDW: 15.2 % (ref 11.5–15.5)
WBC: 9 10*3/uL (ref 4.0–10.5)
nRBC: 0 % (ref 0.0–0.2)

## 2019-11-07 LAB — GLUCOSE, CAPILLARY
Glucose-Capillary: 116 mg/dL — ABNORMAL HIGH (ref 70–99)
Glucose-Capillary: 103 mg/dL — ABNORMAL HIGH (ref 70–99)
Glucose-Capillary: 140 mg/dL — ABNORMAL HIGH (ref 70–99)
Glucose-Capillary: 137 mg/dL — ABNORMAL HIGH (ref 70–99)
Glucose-Capillary: 151 mg/dL — ABNORMAL HIGH (ref 70–99)

## 2019-11-07 LAB — COMPREHENSIVE METABOLIC PANEL
ALT: 19 U/L (ref 0–44)
AST: 17 U/L (ref 15–41)
Albumin: 3.5 g/dL (ref 3.5–5.0)
Alkaline Phosphatase: 110 U/L (ref 38–126)
Anion gap: 8 (ref 5–15)
BUN: 18 mg/dL (ref 8–23)
CO2: 28 mmol/L (ref 22–32)
Calcium: 8.8 mg/dL — ABNORMAL LOW (ref 8.9–10.3)
Chloride: 100 mmol/L (ref 98–111)
Creatinine, Ser: 0.8 mg/dL (ref 0.61–1.24)
GFR calc Af Amer: >60 mL/min (ref >60)
GFR calc non Af Amer: >60 mL/min (ref >60)
Glucose, Bld: 111 mg/dL — ABNORMAL HIGH (ref 70–99)
Potassium: 4.1 mmol/L (ref 3.5–5.1)
Sodium: 136 mmol/L (ref 135–145)
Total Bilirubin: 1 mg/dL (ref 0.3–1.2)
Total Protein: 6.2 g/dL — ABNORMAL LOW (ref 6.5–8.1)

## 2019-11-07 LAB — PROTIME-INR
INR: 1.1 (ref 0.8–1.2)
Prothrombin Time: 13.9 seconds (ref 11.4–15.2)

## 2019-11-07 LAB — CORTISOL-AM, BLOOD: Cortisol - AM: 19 ug/dL (ref 6.7–22.6)

## 2019-11-07 LAB — HEMOGLOBIN A1C
Hgb A1c MFr Bld: 5.8 % — ABNORMAL HIGH (ref 4.8–5.6)
Mean Plasma Glucose: 119.76 mg/dL

## 2019-11-07 LAB — PROCALCITONIN: Procalcitonin: <0.1 ng/mL

## 2019-11-07 MED ORDER — ONDANSETRON HCL 4 MG/2ML IJ SOLN: 4.0000 mg | Freq: Four times a day (QID) | INTRAMUSCULAR | Status: DC | PRN

## 2019-11-07 MED ORDER — METHOTREXATE 25 MG/ML ~~LOC~~ SOSY: 0.8000 mL | PREFILLED_SYRINGE | SUBCUTANEOUS | Status: DC

## 2019-11-07 MED ORDER — RIVASTIGMINE TARTRATE 1.5 MG PO CAPS
1.5000 mg | ORAL_CAPSULE | Freq: Two times a day (BID) | ORAL | Status: DC
  Administered 2019-11-07 – 2019-11-08 (×4): 1.5 mg via ORAL
  Filled 2019-11-07 (×5): qty 1

## 2019-11-07 MED ORDER — SODIUM CHLORIDE 0.9 % IV SOLN
2.0000 g | INTRAVENOUS | Status: DC
  Administered 2019-11-07 – 2019-11-08 (×2): 2 g via INTRAVENOUS
  Filled 2019-11-07: qty 2
  Filled 2019-11-07: qty 20

## 2019-11-07 MED ORDER — QUETIAPINE FUMARATE 25 MG PO TABS
75.0000 mg | ORAL_TABLET | Freq: Every evening | ORAL | Status: DC
  Administered 2019-11-07: 75 mg via ORAL
  Filled 2019-11-07 (×2): qty 3

## 2019-11-07 MED ORDER — INSULIN ASPART 100 UNIT/ML ~~LOC~~ SOLN
0.0000 [IU] | Freq: Three times a day (TID) | SUBCUTANEOUS | Status: DC
  Administered 2019-11-07 (×2): 1 [IU] via SUBCUTANEOUS
  Filled 2019-11-07 (×2): qty 1

## 2019-11-07 MED ORDER — INSULIN ASPART 100 UNIT/ML ~~LOC~~ SOLN: 0.0000 [IU] | Freq: Every day | SUBCUTANEOUS | Status: DC

## 2019-11-07 MED ORDER — ASPIRIN EC 81 MG PO TBEC
81.0000 mg | DELAYED_RELEASE_TABLET | Freq: Every day | ORAL | Status: DC
  Administered 2019-11-07 – 2019-11-08 (×2): 81 mg via ORAL
  Filled 2019-11-07 (×2): qty 1

## 2019-11-07 MED ORDER — FOLIC ACID 1 MG PO TABS
3.0000 mg | ORAL_TABLET | Freq: Every day | ORAL | Status: DC
  Administered 2019-11-07 – 2019-11-08 (×2): 3 mg via ORAL
  Filled 2019-11-07 (×2): qty 3

## 2019-11-07 MED ORDER — ACETAMINOPHEN 325 MG PO TABS
650.0000 mg | ORAL_TABLET | Freq: Four times a day (QID) | ORAL | Status: DC | PRN
  Administered 2019-11-07: 650 mg via ORAL
  Filled 2019-11-07: qty 2

## 2019-11-07 MED ORDER — QUETIAPINE FUMARATE 25 MG PO TABS: 50.0000 mg | ORAL_TABLET | ORAL | Status: DC

## 2019-11-07 MED ORDER — ARMODAFINIL 200 MG PO TABS: 50.0000 mg | ORAL_TABLET | Freq: Every day | ORAL | Status: DC

## 2019-11-07 MED ORDER — CARBIDOPA-LEVODOPA 25-100 MG PO TABS
1.0000 | ORAL_TABLET | Freq: Four times a day (QID) | ORAL | Status: DC
  Administered 2019-11-07 – 2019-11-08 (×5): 1 via ORAL
  Filled 2019-11-07 (×7): qty 1

## 2019-11-07 MED ORDER — AZITHROMYCIN 500 MG PO TABS
250.0000 mg | ORAL_TABLET | Freq: Every day | ORAL | Status: DC
  Administered 2019-11-07 – 2019-11-08 (×2): 250 mg via ORAL
  Filled 2019-11-07 (×2): qty 1

## 2019-11-07 MED ORDER — PAROXETINE HCL 20 MG PO TABS
40.0000 mg | ORAL_TABLET | Freq: Every day | ORAL | Status: DC
  Administered 2019-11-07 – 2019-11-08 (×2): 40 mg via ORAL
  Filled 2019-11-07 (×2): qty 2

## 2019-11-07 MED ORDER — ONDANSETRON HCL 4 MG PO TABS: 4.0000 mg | ORAL_TABLET | Freq: Four times a day (QID) | ORAL | Status: DC | PRN

## 2019-11-07 MED ORDER — ACETAMINOPHEN 650 MG RE SUPP: 650.0000 mg | Freq: Four times a day (QID) | RECTAL | Status: DC | PRN

## 2019-11-07 MED ORDER — ATORVASTATIN CALCIUM 20 MG PO TABS
40.0000 mg | ORAL_TABLET | Freq: Every day | ORAL | Status: DC
  Administered 2019-11-07: 17:00:00 40 mg via ORAL
  Filled 2019-11-07: qty 2

## 2019-11-07 MED ORDER — SODIUM CHLORIDE 0.9 % IV SOLN
500.0000 mg | INTRAVENOUS | Status: DC
  Filled 2019-11-07: qty 500

## 2019-11-07 MED ORDER — ENOXAPARIN SODIUM 40 MG/0.4ML ~~LOC~~ SOLN
40.0000 mg | SUBCUTANEOUS | Status: DC
  Administered 2019-11-07 – 2019-11-08 (×2): 40 mg via SUBCUTANEOUS
  Filled 2019-11-07 (×2): qty 0.4

## 2019-11-07 MED ORDER — BUPROPION HCL ER (XL) 150 MG PO TB24
150.0000 mg | ORAL_TABLET | Freq: Every day | ORAL | Status: DC
  Administered 2019-11-07 – 2019-11-08 (×2): 150 mg via ORAL
  Filled 2019-11-07 (×2): qty 1

## 2019-11-07 MED ORDER — QUETIAPINE FUMARATE 25 MG PO TABS
50.0000 mg | ORAL_TABLET | Freq: Every morning | ORAL | Status: DC
  Administered 2019-11-07 – 2019-11-08 (×2): 50 mg via ORAL
  Filled 2019-11-07 (×2): qty 2

## 2019-11-07 MED ORDER — TAMSULOSIN HCL 0.4 MG PO CAPS
0.4000 mg | ORAL_CAPSULE | Freq: Every day | ORAL | Status: DC
  Administered 2019-11-07 – 2019-11-08 (×2): 0.4 mg via ORAL
  Filled 2019-11-07 (×2): qty 1

## 2019-11-07 NOTE — Progress Notes (Signed)
Seth Mcpherson at [place] NAME: Seth Mcpherson    MR#:  [id number]  DATE OF BIRTH:  [birth year]  SUBJECTIVE:  patient is a retired Engineer, drilling. Wife at bedside. She brings him to the hospital given his altered mental status from his baseline and weakness. Patient has only dementia from Parkinson's started having weakness difficult to keep him awake and appeared to felt warm to touch at home phone. PI patient is awake resting most of the time eating relatively better today  REVIEW OF SYSTEMS:   Review of Systems  Unable to perform ROS: Mental status change   Tolerating Diet:yes Tolerating PT: penidng  DRUG ALLERGIES:  No Known Allergies  VITALS:  Blood pressure 109/67, pulse 76, temperature (!) 97.5 F (36.4 C), temperature source Oral, resp. rate 16, height 6\' 1"  (1.854 m), weight 72.4 kg, SpO2 97 %.  PHYSICAL EXAMINATION:   Physical Exam limited exam GENERAL:  74 y.o.-year-old patient lying in the bed with no acute distress.  HEENT: Head atraumatic, normocephalic. Oropharynx and nasopharynx clear.  NECK:  Supple, no jugular venous distention. No thyroid enlargement, no tenderness.  LUNGS: shallow breath sounds bilaterally, no wheezing, rales, rhonchi. No use of accessory muscles of respiration.  CARDIOVASCULAR: S1, S2 normal. No murmurs, rubs, or gallops.  ABDOMEN: Soft, nontender, nondistended. Bowel sounds present. No organomegaly or mass.  EXTREMITIES: No cyanosis, clubbing or edema b/l.    NEUROLOGIC: grossly nonfocal. Limited exam unable to participate  PSYCHIATRIC:  patient is alert but intermittently confused SKIN: No obvious rash, lesion, or ulcer.  Per RN LABORATORY PANEL:  CBC Recent Labs  Lab 11/07/19 0410  WBC 9.0  HGB 11.3*  HCT 33.6*  PLT 231    Chemistries  Recent Labs  Lab 11/07/19 0410  NA 136  K 4.1  CL 100  CO2 28  GLUCOSE 111*  BUN 18  CREATININE 0.80  CALCIUM 8.8*  AST 17  ALT 19  ALKPHOS 110   BILITOT 1.0   Cardiac Enzymes No results for input(s): TROPONINI in the last 168 hours. RADIOLOGY:  DG Chest Port 1 View  Result Date: 11/06/2019 CLINICAL DATA:  Questionable sepsis. EXAM: PORTABLE CHEST 1 VIEW COMPARISON:  Radiograph 08/10/2019 FINDINGS: Mild rotation. Chronic but slightly progressive rightward mediastinal shift from prior exam, likely accentuated by rotation. There is increasing peribronchial thickening with lower lung volumes. Minimal patchy opacity at both lung bases. Stable heart size and mediastinal contours. No pneumothorax or large pleural effusion. Mild gaseous distention of colon in the upper abdomen. IMPRESSION: 1. Increasing peribronchial thickening with lower lung volumes. Patchy bibasilar opacities may be atelectasis or pneumonia. 2. Chronic but slightly progressive rightward mediastinal shift from prior exam, likely accentuated by rotation. Electronically Signed   By: Keith Rake M.D.   On: 11/06/2019 16:10   ASSESSMENT AND PLAN:   Antione Obar is a 74 y.o. 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  Wife at bedside reported that patient has shown weakness.  She is a Marine scientist and was suspecting UTI or sepsis.     #1 sepsis due to UTI and suspected pneumonia: -sepsis resolved -IV Rocephin and Zithromax to treat both UTI and pneumonia.  - Follow urine which are pending  -blood culture -- negative -afebrile -white count normal -Pro calcitonin less than .10 -chest x-ray does not show  obvious pneumonia possible atelectasis versus mild aspiration/chemical pneumonitis  #2 community-acquired pneumonia: -continue  Continue with Rocephin and Zithromax as per above.  #3 UTI: Again follow urine culture results.  Empirically on Rocephin.  #4 acute encephalopathy secondary to UTI  -  Patient has mild Parkinson's dementia now worsened.  Most likely as a result of sepsis.  Patient already communicating.  Continue treating underlying cause.  #5 Parkinson's disease: sinemet + Mysoline.  #6 early dementia: Seems to be at baseline.  No change in therapy  #7 diabetes type II, well-controlled: Sliding scale insulin.  Hold Metformin today  #8 essential hypertension: Continue with home regimen and monitor blood pressure.  Patient has been followed by palliative care as outpatient. He is a DNR. Patient will resume palliative care follow-up as outpatient after discharge  PT OT to see patient  DVT prophylaxis: Lovenox Code Status:  DNR prior to admission. This was confirmed with patient's wife Family Communication: Wife at bedside Disposition Plan: Home  Consults called: None Admission status: Inpatient  Severity of Illness:  "           patient's presenting symptoms include altered mental status. "           initial radiographic and laboratory data are worrisome because of evidence of possible pneumonia and UTI. "           The chronic co-morbidities include diabetes with Parkinson's disease  Patient needs to be seen by PT. TOC for discharge planning   TOTAL TIME TAKING CARE OF THIS PATIENT: *25* minutes.  >50% time spent on counselling and coordination of care  Note: This dictation was prepared with Dragon dictation along with smaller phrase technology. Any transcriptional errors that result from this process are unintentional.  Fritzi Mandes M.D    [facility]   CC: Primary care physician; Marygrace Drought, MDPatient ID: Seth Mcpherson, male   DOB: 03/26/1946, 74 y.o.   MRN: [id number]

## 2019-11-07 NOTE — Plan of Care (Signed)
  Problem: Elimination: Goal: Will not experience complications related to bowel motility Outcome: Completed/Met

## 2019-11-08 LAB — URINE CULTURE: Culture: >=100000 — AB

## 2019-11-08 LAB — GLUCOSE, CAPILLARY
Glucose-Capillary: 111 mg/dL — ABNORMAL HIGH (ref 70–99)
Glucose-Capillary: 111 mg/dL — ABNORMAL HIGH (ref 70–99)
Glucose-Capillary: 106 mg/dL — ABNORMAL HIGH (ref 70–99)

## 2019-11-08 MED ORDER — CARBIDOPA-LEVODOPA 25-100 MG PO TABS: 1.0000 | ORAL_TABLET | Freq: Four times a day (QID) | ORAL | 0 refills | Status: DC

## 2019-11-08 MED ORDER — CEFDINIR 300 MG PO CAPS: 300.0000 mg | ORAL_CAPSULE | Freq: Two times a day (BID) | ORAL | 0 refills | Status: AC

## 2019-11-08 MED ORDER — CEFDINIR 300 MG PO CAPS: 300.0000 mg | ORAL_CAPSULE | Freq: Two times a day (BID) | ORAL | Status: DC

## 2019-11-08 NOTE — TOC Transition Note (Signed)
Transition of Care [facility]) - CM/SW Discharge Note   Patient Details  Name: Seth Mcpherson MRN: [id number] Date of Birth: 1946/03/29  [facility]) CM/SW Contact:  Elease Hashimoto, LCSW Phone Number: 11/08/2019, 3:16 PM   Clinical Narrative:   Met with pt and wife to discuss discharge needs. Have added [place] aide to PT referral which will help wife with pt's care. She reports it is getting harder and harder to take care of him, especially at night. Discussed hiring aide during the day to give her a break and time away from him. She tried hiring at night and it did not go well with all of their animals. He has all needed equipment and wife is his primary caregiver. [facility] to continue to provide HHPT and now aide. Pt is begin discharged home today and wife aware and hopeful she can get him into the house. No further follow.      Barriers to Discharge: Barriers Resolved   Patient Goals and CMS Choice   CMS Medicare.gov Compare Post Acute Care list provided to:: Patient Choice offered to / list presented to : Patient  Discharge Placement                       Discharge Plan and Services In-house Referral: Clinical Social Work   Post Acute Care Choice: Home Health                    HH Arranged: PT, Nurse's Aide [facility] Agency: [place] ([facility]) Date [place]: 11/08/19 Time [place]: [place] Representative spoke with at [place]: [place] ([place]) Interventions     Readmission Risk Interventions No flowsheet data found.

## 2019-11-08 NOTE — Evaluation (Signed)
Physical Therapy Evaluation Patient Details Name: Seth Mcpherson MRN: [id number] DOB: 12-26-1945 Today's Date: 11/08/2019   History of Present Illness  Patient is a 74 y.o. 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 Found to have sepsis due to UTI and suspected pneumonia, acute encephalopathy secondary to UTI.    Clinical Impression  PT evaluation completed. Patient is pleasant and cooperative during session, able to follow single step commands with extra time. Oriented to person and place. Patient needs Mod A for bed mobility and Max A for sit to stand transfers performed x 2 bouts. Patient has strong posterior lean with transfers and needs cues for technique to facilitate standing. Patient able to stand for less than one minute with heavy use of rolling walker for UE support. Max A- Mod A required to maintain midline standing balance due to posterior lean in standing as well. Patient took several side steps along edge with Max A using rolling walker and heel lift shoe donned (right leg length discrepancy at baseline).  Patient able to participate with supine strengthening therapeutic exercises. Patient wishes to be discharge to home with assistance from family/caregiver support. Patient will need assistance with mobility initially for safety and fall prevention due to generalized weakness. Also recommend HHPT. If family is unable to provide this assistance with mobility, patient will need short term rehab. Recommend to continue PT to address functional limitations listed below to maximize independence.     Follow Up Recommendations Home health PT;Supervision/Assistance - 24 hour    Equipment Recommendations  None recommended by PT    Recommendations for Other Services       Precautions / Restrictions Precautions Precautions:  Fall Required Braces or Orthoses: Other Brace Other Brace: R leg length discrepency, uses heel lift shoe at baseline  Restrictions Weight Bearing Restrictions: No      Mobility  Bed Mobility Overal bed mobility: Needs Assistance Bed Mobility: Supine to Sit;Sit to Supine     Supine to sit: Mod assist Sit to supine: Mod assist   General bed mobility comments: assistance for trunk support from supine to short sitting. assistance for BLE support for sitting to supine. cues for sequencing and task initiation   Transfers Overall transfer level: Needs assistance Equipment used: Rolling walker (2 wheeled) (and heel lift shoe for RLE ) Transfers: Sit to/from Stand Sit to Stand: Max assist         General transfer comment: 4 bouts of standing attempted. Patient is able to come to full standing position x 2 bouts. Cues and faciliation for anterior weight shift, foot placement, hand placement for safety. patient fatigued with activity   Ambulation/Gait Ambulation/Gait assistance: Max assist Gait Distance (Feet): 2 Feet Assistive device: Rolling walker (2 wheeled)       General Gait Details: Patient able to take several small steps along edge of bed with Max A for upright posture. Patient with strong posterior trunk lean in standing position. Standing tolerance limited to ~ 1 minute   Stairs            Wheelchair Mobility    Modified Rankin (Stroke Patients Only)       Balance Overall balance assessment: Needs assistance Sitting-balance support: Feet supported Sitting balance-Leahy Scale: Fair Sitting balance - Comments: intermittent posterior lean that required verbal/tactile cues for midline.    Standing balance support: Bilateral upper extremity supported Standing balance-Leahy  Scale: Zero Standing balance comment: patient needs Max A- Mod A to maintain standing balance with strong posterior lean and heavy use of rolling walker for UE support                               Pertinent Vitals/Pain Pain Assessment: No/denies pain    Home Living Family/patient expects to be discharged to:: Private residence Living Arrangements: Spouse/significant other Available Help at Discharge: Family;Available 24 hours/day Type of Home: House Home Access: Ramped entrance     Home Layout: One level Home Equipment: Tub bench;Walker - 2 wheels;Wheelchair - manual      Prior Function Level of Independence: Needs assistance   Gait / Transfers Assistance Needed: patient reports he ambulates with a walker at home. presumably spouse provides at least intermittent supervision and or assistance for mobility and ADLs at baseline            Hand Dominance        Extremity/Trunk Assessment   Upper Extremity Assessment Upper Extremity Assessment: Generalized weakness    Lower Extremity Assessment Lower Extremity Assessment: Generalized weakness       Communication   Communication: No difficulties  Cognition Arousal/Alertness: Awake/alert Behavior During Therapy: WFL for tasks assessed/performed;Restless Overall Cognitive Status: History of cognitive impairments - at baseline Area of Impairment: Memory;Orientation                 Orientation Level: Disoriented to;Time;Situation   Memory: Decreased short-term memory         General Comments: patient is pleasent and following single step commands with extra time       General Comments      Exercises General Exercises - Lower Extremity Heel Slides: AAROM;Strengthening;Both;10 reps;Supine Hip ABduction/ADduction: AAROM;Strengthening;Both;10 reps;Supine Straight Leg Raises: AAROM;Strengthening;10 reps;Supine Other Exercises Other Exercises: verbal cues for exercise technique    Assessment/Plan    PT Assessment Patient needs continued PT services  PT Problem List Decreased strength;Decreased range of motion;Decreased activity tolerance;Decreased balance;Decreased  mobility;Decreased safety awareness;Decreased knowledge of use of DME;Decreased cognition       PT Treatment Interventions DME instruction;Gait training;Functional mobility training;Therapeutic activities;Therapeutic exercise;Balance training;Neuromuscular re-education;Patient/family education;Cognitive remediation    PT Goals (Current goals can be found in the Care Plan section)  Acute Rehab PT Goals Patient Stated Goal: to go home  PT Goal Formulation: With patient Time For Goal Achievement: 11/22/19 Potential to Achieve Goals: Good    Frequency Min 2X/week   Barriers to discharge        Co-evaluation               AM-PAC PT "6 Clicks" Mobility  Outcome Measure Help needed turning from your back to your side while in a flat bed without using bedrails?: A Little Help needed moving from lying on your back to sitting on the side of a flat bed without using bedrails?: A Lot Help needed moving to and from a bed to a chair (including a wheelchair)?: A Lot Help needed standing up from a chair using your arms (e.g., wheelchair or bedside chair)?: A Lot Help needed to walk in hospital room?: A Lot Help needed climbing 3-5 steps with a railing? : Total 6 Click Score: 12    End of Session Equipment Utilized During Treatment: Gait belt Activity Tolerance: Patient limited by fatigue;Patient tolerated treatment well Patient left: in bed;with bed alarm set;with call bell/phone within reach Nurse Communication: Mobility status (alerted nurse aide  of need to replace condom catheter ) PT Visit Diagnosis: Unsteadiness on feet (R26.81);Muscle weakness (generalized) (M62.81);Difficulty in walking, not elsewhere classified (R26.2)    Time: 9672-8979 PT Time Calculation (min) (ACUTE ONLY): 32 min   Charges:   PT Evaluation $PT Eval Moderate Complexity: 1 Mod PT Treatments $Therapeutic Exercise: 8-22 mins        Minna Merritts, PT, MPT   Percell Locus 11/08/2019, 10:05  AM

## 2019-11-08 NOTE — Discharge Summary (Signed)
[facility] [place] NAME: Seth Mcpherson    MR#:  [id number]  DATE OF BIRTH:  06-11-1945  DATE OF ADMISSION:  11/06/2019 ADMITTING PHYSICIAN: Elwyn Reach, MD  DATE OF DISCHARGE: 11/08/2019  PRIMARY CARE PHYSICIAN: Marygrace Drought, MD    ADMISSION DIAGNOSIS:  Lower urinary tract infectious disease [N39.0] Sepsis secondary to UTI ([place]) [A41.9, N39.0] Sepsis with encephalopathy without septic shock, due to unspecified organism ([place]) [A41.9, R65.20, G93.40]  DISCHARGE DIAGNOSIS:  Sepsis --POA--improved Proteus UTI CAP--mild  SECONDARY DIAGNOSIS:   Past Medical History:  Diagnosis Date  . Cancer ([place])   . Dementia ([place])   . Diabetes mellitus without complication ([place])   . Hypertension   . Parkinson disease ([place])   . 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 Wife at bedside reported that patient has shown weakness. She is a Marine scientist and was suspecting UTI or sepsis.    #1 sepsis due to UTI and suspected pneumonia: -sepsis resolved -IV Rocephin and Zithromax to treat both UTI and pneumonia.--change to po cefdinir -UC--proteus -blood culture -- negative -afebrile -white count normal -Pro calcitonin less than .10 -chest x-ray does not show obvious pneumonia possible atelectasis versus mild aspiration/chemical pneumonitis  #2 community-acquired pneumonia: -changed to po cefdinir  #3 MPN:TIRWE follow urine culture results. Empirically on Rocephin.  #4 acute encephalopathy secondary to UTI  -Patient has mild Parkinson's dementia now worsened. Most likely as a result of sepsis.  -improving mentation  #5 Parkinson's disease: sinemet + Mysoline.  #6 early  dementia:Seems to be at baseline. No change in therapy  #7 diabetes type II, well-controlled:Sliding scale insulin. resume Metformin at d/c  #8 essential hypertension:Continue with home regimen and monitor blood pressure.  Patient has been followed by palliative care as outpatient. He is a DNR. Patient will resume palliative care follow-up as outpatient after discharge  HHPT recommended   DVT prophylaxis:Lovenox Code Status: DNR prior to admission. This was confirmed with patient's wife Family Communication:Wife at bedside Disposition Plan:Home  Consults called:None Admission status:Inpatient  Severity of Illness:  "patient's presenting symptoms includealtered mental status. "initial radiographic and laboratory data are worrisome because ofevidence of possible pneumonia and UTI. "The chronic co-morbidities includediabetes with Parkinson's disease  Patient needs to be seen by PT. TOC for discharge planning--HHPT and aide  Overall improving slowly Will d/c to home D/w wife [name] CONSULTS OBTAINED:    DRUG ALLERGIES:  No Known Allergies  DISCHARGE MEDICATIONS:   Allergies as of 11/08/2019   No Known Allergies     Medication List    TAKE these medications   Armodafinil 200 MG Tabs Take 50 mg by mouth daily.   aspirin EC 81 MG tablet Take 81 mg by mouth daily.   atorvastatin 40 MG tablet Commonly known as: LIPITOR Take 40 mg by mouth daily.   buPROPion 150 MG 24 hr tablet Commonly known as: WELLBUTRIN XL Take 150 mg by mouth daily.   carbidopa-levodopa 25-100 MG tablet Commonly known as: SINEMET IR Take 1 tablet by mouth 4 (four) times daily.   cefdinir 300 MG capsule Commonly known as: OMNICEF Take 1 capsule (300 mg total) by mouth every 12 (twelve) hours for 5 days. Start taking on: November 08, 3152   folic acid  1 MG tablet Commonly known as: FOLVITE Take 3 mg by mouth daily.   metFORMIN 1000 MG  tablet Commonly known as: GLUCOPHAGE Take 1,000 mg by mouth 2 (two) times daily with a meal.   Methotrexate 25 MG/ML Sosy Inject 0.8 mLs into the skin every Friday.   PARoxetine 40 MG tablet Commonly known as: PAXIL Take 1 tablet (40 mg total) by mouth daily.   QUEtiapine 25 MG tablet Commonly known as: SEROQUEL Take 50-75 mg by mouth See admin instructions. Take 2 tablets (50mg ) by mouth every morning and take 3 tablets (75mg ) by mouth every night   rivastigmine 1.5 MG capsule Commonly known as: EXELON Take 1 capsule (1.5 mg total) by mouth 2 (two) times daily.   tamsulosin 0.4 MG Caps capsule Commonly known as: FLOMAX Take 0.4 mg by mouth daily.       If you experience worsening of your admission symptoms, develop shortness of breath, life threatening emergency, suicidal or homicidal thoughts you must seek medical attention immediately by calling 911 or calling your MD immediately  if symptoms less severe.  You Must read complete instructions/literature along with all the possible adverse reactions/side effects for all the Medicines you take and that have been prescribed to you. Take any new Medicines after you have completely understood and accept all the possible adverse reactions/side effects.   Please note  You were cared for by a hospitalist during your hospital stay. If you have any questions about your discharge medications or the care you received while you were in the hospital after you are discharged, you can call the unit and asked to speak with the hospitalist on call if the hospitalist that took care of you is not available. Once you are discharged, your primary care physician will handle any further medical issues. Please note that NO REFILLS for any discharge medications will be authorized once you are discharged, as it is imperative that you return to your primary care physician (or establish a relationship with a primary care physician if you do not have one) for  your aftercare needs so that they can reassess your need for medications and monitor your lab values. Today   SUBJECTIVE   Worked with PT earlier Ate better More awake today  VITAL SIGNS:  Blood pressure 107/80, pulse 84, temperature 97.8 F (36.6 C), temperature source Oral, resp. rate 16, height 6\' 1"  (1.854 m), weight 72.4 kg, SpO2 92 %.  I/O:    Intake/Output Summary (Last 24 hours) at 11/08/2019 1411 Last data filed at 11/08/2019 0500 Gross per 24 hour  Intake --  Output 300 ml  Net -300 ml    PHYSICAL EXAMINATION:  GENERAL:  74 y.o.-year-old patient lying in the bed with no acute distress. weak EYES: Pupils equal, round, reactive to light and accommodation. No scleral icterus.  HEENT: Head atraumatic, normocephalic. Oropharynx and nasopharynx clear.  NECK:  Supple, no jugular venous distention. No thyroid enlargement, no tenderness.  LUNGS: Normal breath sounds bilaterally, no wheezing, rales,rhonchi or crepitation. No use of accessory muscles of respiration.  CARDIOVASCULAR: S1, S2 normal. No murmurs, rubs, or gallops.  ABDOMEN: Soft, non-tender, non-distended. Bowel sounds present. No organomegaly or mass.  EXTREMITIES: No pedal edema, cyanosis, or clubbing.  NEUROLOGIC: Cranial nerves II through XII are intact. Muscle strength 4+/5 in all extremities. Sensation intact. Gait not checked.  PSYCHIATRIC: The patient is alert and awake  SKIN:  per RN  DATA REVIEW:   CBC  Recent Labs  Lab 11/07/19 0410  WBC 9.0  HGB 11.3*  HCT 33.6*  PLT 231    Chemistries  Recent Labs  Lab 11/07/19 0410  NA 136  K 4.1  CL 100  CO2 28  GLUCOSE 111*  BUN 18  CREATININE 0.80  CALCIUM 8.8*  AST 17  ALT 19  ALKPHOS 110  BILITOT 1.0    Microbiology Results   Recent Results (from the past 240 hour(s))  Blood culture (routine x 2)     Status: None (Preliminary result)   Collection Time: 11/06/19  3:57 PM   Specimen: BLOOD  Result Value Ref Range Status   Specimen  Description BLOOD BLOOD LEFT FOREARM  Final   Special Requests   Final    BOTTLES DRAWN AEROBIC AND ANAEROBIC Blood Culture results may not be optimal due to an inadequate volume of blood received in culture bottles   Culture   Final    NO GROWTH 2 DAYS Performed at [facility], [address] [place], [place] [address]    Report Status PENDING  Incomplete  Blood culture (routine x 2)     Status: None (Preliminary result)   Collection Time: 11/06/19  3:57 PM   Specimen: BLOOD  Result Value Ref Range Status   Specimen Description BLOOD BLOOD LEFT HAND  Final   Special Requests   Final    BOTTLES DRAWN AEROBIC AND ANAEROBIC Blood Culture results may not be optimal due to an inadequate volume of blood received in culture bottles   Culture   Final    NO GROWTH 2 DAYS Performed at [facility], [address] [place], [place] [address]    Report Status PENDING  Incomplete  Urine culture     Status: Abnormal   Collection Time: 11/06/19  5:45 PM   Specimen: In/Out Cath Urine  Result Value Ref Range Status   Specimen Description   Final    IN/OUT CATH URINE Performed at [facility], [address], [place], [place] [address]    Special Requests   Final    NONE Performed at [facility], [address] [place], [place] [address]    Culture >=100,000 COLONIES/mL PROTEUS MIRABILIS (A)  Final   Report Status 11/08/2019 FINAL  Final   Organism ID, Bacteria PROTEUS MIRABILIS (A)  Final      Susceptibility   Proteus mirabilis - MIC*    AMPICILLIN <=2 SENSITIVE Sensitive     CEFAZOLIN <=4 SENSITIVE Sensitive     CEFTRIAXONE <=0.25 SENSITIVE Sensitive     CIPROFLOXACIN <=0.25 SENSITIVE Sensitive     GENTAMICIN <=1 SENSITIVE Sensitive     IMIPENEM 1 SENSITIVE Sensitive     NITROFURANTOIN 128 RESISTANT Resistant     TRIMETH/SULFA <=20 SENSITIVE Sensitive     AMPICILLIN/SULBACTAM <=2 SENSITIVE Sensitive     PIP/TAZO <=4 SENSITIVE Sensitive     *  >=100,000 COLONIES/mL PROTEUS MIRABILIS  SARS Coronavirus 2 by RT PCR (hospital order, performed in [place] hospital lab) Nasopharyngeal Nasopharyngeal Swab     Status: None   Collection Time: 11/06/19  5:45 PM   Specimen: Nasopharyngeal Swab  Result Value Ref Range Status   SARS Coronavirus 2 NEGATIVE NEGATIVE Final    Comment: (NOTE) SARS-CoV-2 target nucleic acids are NOT DETECTED.  The SARS-CoV-2 RNA is generally detectable in upper and lower respiratory specimens during the acute phase of infection. The lowest concentration of SARS-CoV-2 viral copies this assay can detect is 250 copies / mL. A negative result does not  preclude SARS-CoV-2 infection and should not be used as the sole basis for treatment or other patient management decisions.  A negative result may occur with improper specimen collection / handling, submission of specimen other than nasopharyngeal swab, presence of viral mutation(s) within the areas targeted by this assay, and inadequate number of viral copies (<250 copies / mL). A negative result must be combined with clinical observations, patient history, and epidemiological information.  Fact Sheet for Patients:   StrictlyIdeas.no  Fact Sheet for Healthcare Providers: BankingDealers.co.za  This test is not yet approved or  cleared by the [place] FDA and has been authorized for detection and/or diagnosis of SARS-CoV-2 by FDA under an Emergency Use Authorization (EUA).  This EUA will remain in effect (meaning this test can be used) for the duration of the COVID-19 declaration under Section 564(b)(1) of the Act, 21 U.S.C. section 360bbb-3(b)(1), unless the authorization is terminated or revoked sooner.  Performed at [facility], [place] [place], [place] [address]     RADIOLOGY:  [facility] Chest Port 1 View  Result Date: 11/06/2019 CLINICAL DATA:  Questionable sepsis. EXAM: PORTABLE CHEST  1 VIEW COMPARISON:  Radiograph 08/10/2019 FINDINGS: Mild rotation. Chronic but slightly progressive rightward mediastinal shift from prior exam, likely accentuated by rotation. There is increasing peribronchial thickening with lower lung volumes. Minimal patchy opacity at both lung bases. Stable heart size and mediastinal contours. No pneumothorax or large pleural effusion. Mild gaseous distention of colon in the upper abdomen. IMPRESSION: 1. Increasing peribronchial thickening with lower lung volumes. Patchy bibasilar opacities may be atelectasis or pneumonia. 2. Chronic but slightly progressive rightward mediastinal shift from prior exam, likely accentuated by rotation. Electronically Signed   By: Keith Rake M.D.   On: 11/06/2019 16:10     CODE STATUS:     Code Status Orders  (From admission, onward)         Start     Ordered   11/07/19 1400  Do not attempt resuscitation (DNR)  Continuous       Question Answer Comment  In the event of cardiac or respiratory ARREST Do not call a "code blue"   In the event of cardiac or respiratory ARREST Do not perform Intubation, CPR, defibrillation or ACLS   In the event of cardiac or respiratory ARREST Use medication by any route, position, wound care, and other measures to relive pain and suffering. May use oxygen, suction and manual treatment of airway obstruction as needed for comfort.   Comments confirmed with wife in the room      11/07/19 1359        Code Status History    Date Active Date Inactive Code Status Order ID Comments User Context   11/07/2019 0055 11/07/2019 1359 Full Code [id number]  Elwyn Reach, MD Inpatient   08/05/2019 0223 08/15/2019 2312 Full Code [id number]  Athena Masse, MD ED   Advance Care Planning Activity    Advance Directive Documentation     Most Recent Value  Type of Advance Directive Out of facility DNR (pink MOST or yellow form)  Pre-existing out of facility DNR order (yellow form or pink MOST form) --   "MOST" Form in Place? --       TOTAL TIME TAKING CARE OF THIS PATIENT: *35* minutes.    Fritzi Mandes M.D  [facility]    CC: Primary care physician; Marygrace Drought, MD

## 2019-11-11 LAB — CULTURE, BLOOD (ROUTINE X 2)
Culture: NO GROWTH
Culture: NO GROWTH

## 2019-12-27 ENCOUNTER — Other Ambulatory Visit: Payer: Medicare HMO | Admitting: Adult Health Nurse Practitioner

## 2019-12-27 ENCOUNTER — Other Ambulatory Visit: Payer: Self-pay

## 2019-12-27 DIAGNOSIS — G20 Parkinson's disease: Secondary | ICD-10-CM

## 2019-12-27 DIAGNOSIS — Z515 Encounter for palliative care: Secondary | ICD-10-CM

## 2019-12-27 NOTE — Progress Notes (Signed)
[facility] Palliative Care Consult Note Telephone: [phone]  Fax: [phone]  PATIENT NAME: Seth Mcpherson DOB: April 12, 1945 MRN: [id number]  PRIMARY CARE PROVIDER:   Marygrace Drought, MD  REFERRING PROVIDER:  Marygrace Drought, MD No address on file  RESPONSIBLE PARTY:  Kennieth Plotts, wife [phone]    RECOMMENDATIONS and PLAN:  1.  Advanced care planning.  Patient is DNR.  2.  Functional status.  Since being in hospital for UTI patient is unable to help with transfers.  He is bedbound and requires total care with ADLs.  He is able to feed himself. Patient is having softer tone of voice and wife states that it does seem like he slurs his words sometimes.  This is more likely due to his Parkinson's.  Due to functional decline wife is placing in SNF for rehab, see below.    3.  Nutritional status.  Wife states that his appetite is poor and eats only about half of what he used to.  She is unable to get him on scales but believes he looks thinner, especially in his legs.  They should be able to monitor his weight more closely at the facility.    4.  Agitation.  Was taking seroquel  50 mg in the AM and 75 mg at night.  This has been changed to take 125 mg just at night and none in the morning.  Wife states that it seems to work well giving it to him at night.  He is able to sleep during the night but not be drowsy in the morning.  Continue current dose of seroquel.    5.  Pain left foot.  Patient states that there is a spot on his left heel that is painful.  There is a small scabbed area but no erythema, warmth to touch, drainage, ecchymosis, or bogginess noted.  Wife states that he thinks he stepped on glass and has a piece of glass in his foot.  There are no signs of irritation, trauma, or infection.  Wife states that he does rub his heels on the bed a lot and this may be causing some irritation.  Patient will be transitioning to SNF where our services  will not be continued.  Encouraged to contact us once he comes back home to resume services.  I spent 80 minutes providing this consultation,  from 2:00 to 3:20 including time spent with patient/family, chart review, provider coordination, documentation. More than 50% of the time in this consultation was spent coordinating communication.   HISTORY OF PRESENT ILLNESS:  Seth Mcpherson is a 74 y.o. year old male with multiple medical problems including Parkinson's disease,cognitive impairment, fx right hip with no intervention, prostate cancer. Palliative Care was asked to help address goals of care. Patient had hospitalization 8/1-11/08/19 for sepsis due to UTI.  Wife states that his functional status has declined since then and she is placing him temporarily in SNF for rehab.  Was supposed to go to [facility] last Friday but did not have a PASSR number and are currently waiting on this.  Believed that he will be able to transition to SNF by the end of the week.  Informed patient and wife that [facility] does not have services at [facility] but they do have their own Palliative services if they are interested in continuing with palliative.  Advised to call me when he gets back home to resume palliative at home.    CODE STATUS: DNR  PPS:  30% HOSPICE ELIGIBILITY/DIAGNOSIS: TBD  PHYSICAL EXAM:  BP 102/62  HR 78  O2 94% on RA General: NAD, frail appearing, thin Cardiovascular: regular rate and rhythm Pulmonary: clear ant fields Abdomen: soft, nontender, + bowel sounds GU: no suprapubic tenderness Extremities: no edema, no joint deformities Skin: no rashes on exposed skin Neurological: Weakness; A&O to person and place   PAST MEDICAL HISTORY:  Past Medical History:  Diagnosis Date  . Cancer ([place])   . Dementia ([place])   . Diabetes mellitus without complication ([place])   . Hypertension   . Parkinson disease ([place])   . Prostate cancer [facility])     SOCIAL HX:  Social History   Tobacco Use    . Smoking status: Former Research scientist (life sciences)  . Smokeless tobacco: Never Used  Substance Use Topics  . Alcohol use: Not Currently    ALLERGIES: No Known Allergies   PERTINENT MEDICATIONS:  Outpatient Encounter Medications as of 12/27/2019  Medication Sig  . Armodafinil 200 MG TABS Take 50 mg by mouth daily.   Marland Kitchen aspirin EC 81 MG tablet Take 81 mg by mouth daily.  Marland Kitchen atorvastatin (LIPITOR) 40 MG tablet Take 40 mg by mouth daily.  Marland Kitchen buPROPion (WELLBUTRIN XL) 150 MG 24 hr tablet Take 150 mg by mouth daily.  . carbidopa-levodopa (SINEMET IR) 25-100 MG tablet Take 1 tablet by mouth 4 (four) times daily.  . folic acid (FOLVITE) 1 MG tablet Take 3 mg by mouth daily.  . metFORMIN (GLUCOPHAGE) 1000 MG tablet Take 1,000 mg by mouth 2 (two) times daily with a meal.  . Methotrexate 25 MG/ML SOSY Inject 0.8 mLs into the skin every Friday.   Marland Kitchen PARoxetine (PAXIL) 40 MG tablet Take 1 tablet (40 mg total) by mouth daily.  . QUEtiapine (SEROQUEL) 25 MG tablet Take 50-75 mg by mouth See admin instructions. Take 2 tablets (50mg ) by mouth every morning and take 3 tablets (75mg ) by mouth every night  . rivastigmine (EXELON) 1.5 MG capsule Take 1 capsule (1.5 mg total) by mouth 2 (two) times daily.  . tamsulosin (FLOMAX) 0.4 MG CAPS capsule Take 0.4 mg by mouth daily.    No facility-administered encounter medications on file as of 12/27/2019.     [place] Wank Jenetta Downer, NP

## 2019-12-29 ENCOUNTER — Other Ambulatory Visit: Payer: Self-pay

## 2019-12-29 ENCOUNTER — Encounter: Payer: Self-pay | Admitting: *Deleted

## 2019-12-29 ENCOUNTER — Emergency Department: Payer: Medicare HMO

## 2019-12-29 DIAGNOSIS — C61 Malignant neoplasm of prostate: Secondary | ICD-10-CM | POA: Diagnosis present

## 2019-12-29 DIAGNOSIS — R911 Solitary pulmonary nodule: Secondary | ICD-10-CM | POA: Diagnosis present

## 2019-12-29 DIAGNOSIS — E119 Type 2 diabetes mellitus without complications: Secondary | ICD-10-CM | POA: Diagnosis present

## 2019-12-29 DIAGNOSIS — F0280 Dementia in other diseases classified elsewhere without behavioral disturbance: Secondary | ICD-10-CM | POA: Diagnosis present

## 2019-12-29 DIAGNOSIS — Z8249 Family history of ischemic heart disease and other diseases of the circulatory system: Secondary | ICD-10-CM

## 2019-12-29 DIAGNOSIS — Z20822 Contact with and (suspected) exposure to covid-19: Secondary | ICD-10-CM | POA: Diagnosis present

## 2019-12-29 DIAGNOSIS — F329 Major depressive disorder, single episode, unspecified: Secondary | ICD-10-CM | POA: Diagnosis present

## 2019-12-29 DIAGNOSIS — L129 Pemphigoid, unspecified: Secondary | ICD-10-CM | POA: Diagnosis present

## 2019-12-29 DIAGNOSIS — R05 Cough: Secondary | ICD-10-CM | POA: Diagnosis not present

## 2019-12-29 DIAGNOSIS — I10 Essential (primary) hypertension: Secondary | ICD-10-CM | POA: Diagnosis present

## 2019-12-29 DIAGNOSIS — N179 Acute kidney failure, unspecified: Secondary | ICD-10-CM | POA: Diagnosis present

## 2019-12-29 DIAGNOSIS — A419 Sepsis, unspecified organism: Secondary | ICD-10-CM | POA: Diagnosis not present

## 2019-12-29 DIAGNOSIS — E785 Hyperlipidemia, unspecified: Secondary | ICD-10-CM | POA: Diagnosis present

## 2019-12-29 DIAGNOSIS — R0902 Hypoxemia: Secondary | ICD-10-CM | POA: Diagnosis present

## 2019-12-29 DIAGNOSIS — G20 Parkinson's disease: Secondary | ICD-10-CM | POA: Diagnosis present

## 2019-12-29 DIAGNOSIS — N390 Urinary tract infection, site not specified: Secondary | ICD-10-CM | POA: Diagnosis present

## 2019-12-29 DIAGNOSIS — R6521 Severe sepsis with septic shock: Secondary | ICD-10-CM | POA: Diagnosis present

## 2019-12-29 DIAGNOSIS — Z794 Long term (current) use of insulin: Secondary | ICD-10-CM

## 2019-12-29 DIAGNOSIS — Z87891 Personal history of nicotine dependence: Secondary | ICD-10-CM

## 2019-12-29 DIAGNOSIS — K580 Irritable bowel syndrome with diarrhea: Secondary | ICD-10-CM | POA: Diagnosis present

## 2019-12-29 DIAGNOSIS — Z66 Do not resuscitate: Secondary | ICD-10-CM | POA: Diagnosis present

## 2019-12-29 DIAGNOSIS — Z807 Family history of other malignant neoplasms of lymphoid, hematopoietic and related tissues: Secondary | ICD-10-CM

## 2019-12-29 DIAGNOSIS — Z79899 Other long term (current) drug therapy: Secondary | ICD-10-CM

## 2019-12-29 LAB — BASIC METABOLIC PANEL
Anion gap: 15 (ref 5–15)
BUN: 23 mg/dL (ref 8–23)
CO2: 26 mmol/L (ref 22–32)
Calcium: 9.2 mg/dL (ref 8.9–10.3)
Chloride: 97 mmol/L — ABNORMAL LOW (ref 98–111)
Creatinine, Ser: 0.88 mg/dL (ref 0.61–1.24)
GFR calc Af Amer: >60 mL/min (ref >60)
GFR calc non Af Amer: >60 mL/min (ref >60)
Glucose, Bld: 111 mg/dL — ABNORMAL HIGH (ref 70–99)
Potassium: 4.3 mmol/L (ref 3.5–5.1)
Sodium: 138 mmol/L (ref 135–145)

## 2019-12-29 LAB — CBC
HCT: 42 % (ref 39.0–52.0)
Hemoglobin: 13.6 g/dL (ref 13.0–17.0)
MCH: 29.1 pg (ref 26.0–34.0)
MCHC: 32.4 g/dL (ref 30.0–36.0)
MCV: 89.9 fL (ref 80.0–100.0)
Platelets: 232 10*3/uL (ref 150–400)
RBC: 4.67 MIL/uL (ref 4.22–5.81)
RDW: 16.1 % — ABNORMAL HIGH (ref 11.5–15.5)
WBC: 7.6 10*3/uL (ref 4.0–10.5)
nRBC: 0 % (ref 0.0–0.2)

## 2019-12-29 LAB — TROPONIN I (HIGH SENSITIVITY): Troponin I (High Sensitivity): 3 ng/L (ref <18)

## 2019-12-29 NOTE — ED Triage Notes (Signed)
Pt to triage via wheelchair.  Pt has weakness, cough and burning with urination.  Sx began yesterday.  Hx dementia.  Pt alert.

## 2019-12-29 NOTE — ED Notes (Signed)
First Nurse Note: Pt to ED via [facility] from home for cough with grey sputum, intermittent fever. Pt has diminished lung sounds in the RLL Pt has hx/o  [place]  134/80 94% NSR

## 2019-12-30 ENCOUNTER — Emergency Department: Payer: Medicare HMO

## 2019-12-30 ENCOUNTER — Encounter: Payer: Self-pay | Admitting: Internal Medicine

## 2019-12-30 ENCOUNTER — Inpatient Hospital Stay
Admission: EM | Admit: 2019-12-30 | Discharge: 2020-01-03 | DRG: 871 | Disposition: A | Discharge status: Skilled Nursing Facility | Payer: Medicare HMO | Attending: Internal Medicine | Admitting: Internal Medicine

## 2019-12-30 ENCOUNTER — Other Ambulatory Visit: Payer: Self-pay

## 2019-12-30 DIAGNOSIS — R6521 Severe sepsis with septic shock: Secondary | ICD-10-CM

## 2019-12-30 DIAGNOSIS — G20 Parkinson's disease: Secondary | ICD-10-CM

## 2019-12-30 DIAGNOSIS — R05 Cough: Secondary | ICD-10-CM | POA: Diagnosis present

## 2019-12-30 DIAGNOSIS — N390 Urinary tract infection, site not specified: Secondary | ICD-10-CM | POA: Diagnosis present

## 2019-12-30 DIAGNOSIS — G3183 Dementia with Lewy bodies: Secondary | ICD-10-CM

## 2019-12-30 DIAGNOSIS — E119 Type 2 diabetes mellitus without complications: Secondary | ICD-10-CM

## 2019-12-30 DIAGNOSIS — F329 Major depressive disorder, single episode, unspecified: Secondary | ICD-10-CM | POA: Diagnosis present

## 2019-12-30 DIAGNOSIS — R911 Solitary pulmonary nodule: Secondary | ICD-10-CM | POA: Diagnosis present

## 2019-12-30 DIAGNOSIS — N3000 Acute cystitis without hematuria: Secondary | ICD-10-CM | POA: Diagnosis not present

## 2019-12-30 DIAGNOSIS — R059 Cough, unspecified: Secondary | ICD-10-CM | POA: Diagnosis present

## 2019-12-30 DIAGNOSIS — R0902 Hypoxemia: Secondary | ICD-10-CM | POA: Diagnosis present

## 2019-12-30 DIAGNOSIS — F32A Depression, unspecified: Secondary | ICD-10-CM | POA: Diagnosis present

## 2019-12-30 DIAGNOSIS — A419 Sepsis, unspecified organism: Principal | ICD-10-CM

## 2019-12-30 DIAGNOSIS — Z20822 Contact with and (suspected) exposure to covid-19: Secondary | ICD-10-CM | POA: Diagnosis present

## 2019-12-30 DIAGNOSIS — Z79899 Other long term (current) drug therapy: Secondary | ICD-10-CM | POA: Diagnosis not present

## 2019-12-30 DIAGNOSIS — C61 Malignant neoplasm of prostate: Secondary | ICD-10-CM | POA: Diagnosis present

## 2019-12-30 DIAGNOSIS — F0390 Unspecified dementia without behavioral disturbance: Secondary | ICD-10-CM | POA: Diagnosis present

## 2019-12-30 DIAGNOSIS — I10 Essential (primary) hypertension: Secondary | ICD-10-CM | POA: Diagnosis present

## 2019-12-30 DIAGNOSIS — R918 Other nonspecific abnormal finding of lung field: Secondary | ICD-10-CM | POA: Diagnosis present

## 2019-12-30 DIAGNOSIS — Z8249 Family history of ischemic heart disease and other diseases of the circulatory system: Secondary | ICD-10-CM | POA: Diagnosis not present

## 2019-12-30 DIAGNOSIS — E785 Hyperlipidemia, unspecified: Secondary | ICD-10-CM

## 2019-12-30 DIAGNOSIS — K580 Irritable bowel syndrome with diarrhea: Secondary | ICD-10-CM | POA: Diagnosis present

## 2019-12-30 DIAGNOSIS — Z87891 Personal history of nicotine dependence: Secondary | ICD-10-CM | POA: Diagnosis not present

## 2019-12-30 DIAGNOSIS — N179 Acute kidney failure, unspecified: Secondary | ICD-10-CM | POA: Diagnosis present

## 2019-12-30 DIAGNOSIS — L129 Pemphigoid, unspecified: Secondary | ICD-10-CM | POA: Diagnosis present

## 2019-12-30 DIAGNOSIS — Z66 Do not resuscitate: Secondary | ICD-10-CM | POA: Diagnosis present

## 2019-12-30 DIAGNOSIS — F0280 Dementia in other diseases classified elsewhere without behavioral disturbance: Secondary | ICD-10-CM | POA: Diagnosis present

## 2019-12-30 DIAGNOSIS — Z794 Long term (current) use of insulin: Secondary | ICD-10-CM | POA: Diagnosis not present

## 2019-12-30 DIAGNOSIS — Z807 Family history of other malignant neoplasms of lymphoid, hematopoietic and related tissues: Secondary | ICD-10-CM | POA: Diagnosis not present

## 2019-12-30 LAB — COMPREHENSIVE METABOLIC PANEL
ALT: 15 U/L (ref 0–44)
AST: 27 U/L (ref 15–41)
Albumin: 4.8 g/dL (ref 3.5–5.0)
Alkaline Phosphatase: 110 U/L (ref 38–126)
Anion gap: 21 — ABNORMAL HIGH (ref 5–15)
BUN: 32 mg/dL — ABNORMAL HIGH (ref 8–23)
CO2: 18 mmol/L — ABNORMAL LOW (ref 22–32)
Calcium: 9.9 mg/dL (ref 8.9–10.3)
Chloride: 100 mmol/L (ref 98–111)
Creatinine, Ser: 1.42 mg/dL — ABNORMAL HIGH (ref 0.61–1.24)
GFR calc Af Amer: 56 mL/min — ABNORMAL LOW (ref >60)
GFR calc non Af Amer: 48 mL/min — ABNORMAL LOW (ref >60)
Glucose, Bld: 157 mg/dL — ABNORMAL HIGH (ref 70–99)
Potassium: 4.7 mmol/L (ref 3.5–5.1)
Sodium: 139 mmol/L (ref 135–145)
Total Bilirubin: 1.3 mg/dL — ABNORMAL HIGH (ref 0.3–1.2)
Total Protein: 8.2 g/dL — ABNORMAL HIGH (ref 6.5–8.1)

## 2019-12-30 LAB — CBC WITH DIFFERENTIAL/PLATELET
Abs Immature Granulocytes: 0.08 10*3/uL — ABNORMAL HIGH (ref 0.00–0.07)
Basophils Absolute: 0 10*3/uL (ref 0.0–0.1)
Basophils Relative: 0 %
Eosinophils Absolute: 0 10*3/uL (ref 0.0–0.5)
Eosinophils Relative: 0 %
HCT: 44.1 % (ref 39.0–52.0)
Hemoglobin: 14.7 g/dL (ref 13.0–17.0)
Immature Granulocytes: 1 %
Lymphocytes Relative: 4 %
Lymphs Abs: 0.5 10*3/uL — ABNORMAL LOW (ref 0.7–4.0)
MCH: 29.2 pg (ref 26.0–34.0)
MCHC: 33.3 g/dL (ref 30.0–36.0)
MCV: 87.7 fL (ref 80.0–100.0)
Monocytes Absolute: 0.7 10*3/uL (ref 0.1–1.0)
Monocytes Relative: 6 %
Neutro Abs: 10.9 10*3/uL — ABNORMAL HIGH (ref 1.7–7.7)
Neutrophils Relative %: 89 %
Platelets: 257 10*3/uL (ref 150–400)
RBC: 5.03 MIL/uL (ref 4.22–5.81)
RDW: 16.2 % — ABNORMAL HIGH (ref 11.5–15.5)
WBC: 12.3 10*3/uL — ABNORMAL HIGH (ref 4.0–10.5)
nRBC: 0 % (ref 0.0–0.2)

## 2019-12-30 LAB — URINALYSIS, COMPLETE (UACMP) WITH MICROSCOPIC
Bilirubin Urine: NEGATIVE
Glucose, UA: NEGATIVE mg/dL
Hgb urine dipstick: NEGATIVE
Ketones, ur: 5 mg/dL — AB
Nitrite: NEGATIVE
Protein, ur: 30 mg/dL — AB
Specific Gravity, Urine: 1.025 (ref 1.005–1.030)
pH: 5 (ref 5.0–8.0)

## 2019-12-30 LAB — PROCALCITONIN: Procalcitonin: <0.1 ng/mL

## 2019-12-30 LAB — RESPIRATORY PANEL BY RT PCR (FLU A&B, COVID)
Influenza A by PCR: NEGATIVE
Influenza B by PCR: NEGATIVE
SARS Coronavirus 2 by RT PCR: NEGATIVE

## 2019-12-30 LAB — PROTIME-INR
INR: 1 (ref 0.8–1.2)
Prothrombin Time: 12.8 seconds (ref 11.4–15.2)

## 2019-12-30 LAB — LACTIC ACID, PLASMA
Lactic Acid, Venous: 7.9 mmol/L (ref 0.5–1.9)
Lactic Acid, Venous: 1.5 mmol/L (ref 0.5–1.9)
Lactic Acid, Venous: 4 mmol/L (ref 0.5–1.9)

## 2019-12-30 LAB — GLUCOSE, CAPILLARY
Glucose-Capillary: 82 mg/dL (ref 70–99)
Glucose-Capillary: 117 mg/dL — ABNORMAL HIGH (ref 70–99)
Glucose-Capillary: 105 mg/dL — ABNORMAL HIGH (ref 70–99)

## 2019-12-30 LAB — APTT: aPTT: 32 seconds (ref 24–36)

## 2019-12-30 MED ORDER — INSULIN ASPART 100 UNIT/ML ~~LOC~~ SOLN: 0.0000 [IU] | Freq: Every day | SUBCUTANEOUS | Status: DC

## 2019-12-30 MED ORDER — ONDANSETRON HCL 4 MG/2ML IJ SOLN: 4.0000 mg | Freq: Three times a day (TID) | INTRAMUSCULAR | Status: DC | PRN

## 2019-12-30 MED ORDER — TAMSULOSIN HCL 0.4 MG PO CAPS
0.4000 mg | ORAL_CAPSULE | Freq: Every day | ORAL | Status: DC
  Administered 2019-12-31 – 2020-01-03 (×4): 0.4 mg via ORAL
  Filled 2019-12-30 (×4): qty 1

## 2019-12-30 MED ORDER — LACTATED RINGERS IV BOLUS (SEPSIS)
1000.0000 mL | Freq: Once | INTRAVENOUS | Status: AC
  Administered 2019-12-30: 1000 mL via INTRAVENOUS

## 2019-12-30 MED ORDER — MEMANTINE HCL 5 MG PO TABS
5.0000 mg | ORAL_TABLET | Freq: Two times a day (BID) | ORAL | Status: DC
  Administered 2019-12-30 – 2020-01-03 (×9): 5 mg via ORAL
  Filled 2019-12-30 (×9): qty 1

## 2019-12-30 MED ORDER — RIVASTIGMINE TARTRATE 1.5 MG PO CAPS
1.5000 mg | ORAL_CAPSULE | Freq: Two times a day (BID) | ORAL | Status: DC
  Administered 2019-12-30 – 2020-01-03 (×9): 1.5 mg via ORAL
  Filled 2019-12-30 (×11): qty 1

## 2019-12-30 MED ORDER — SODIUM CHLORIDE 0.9 % IV SOLN
1.0000 g | Freq: Once | INTRAVENOUS | Status: AC
  Administered 2019-12-30: 1 g via INTRAVENOUS
  Filled 2019-12-30: qty 10

## 2019-12-30 MED ORDER — DM-GUAIFENESIN ER 30-600 MG PO TB12: 1.0000 | ORAL_TABLET | Freq: Two times a day (BID) | ORAL | Status: DC | PRN

## 2019-12-30 MED ORDER — ALBUTEROL SULFATE HFA 108 (90 BASE) MCG/ACT IN AERS
2.0000 | INHALATION_SPRAY | RESPIRATORY_TRACT | Status: DC | PRN
  Filled 2019-12-30: qty 6.7

## 2019-12-30 MED ORDER — FOLIC ACID 1 MG PO TABS
3.0000 mg | ORAL_TABLET | Freq: Every day | ORAL | Status: DC
  Administered 2019-12-30 – 2020-01-03 (×5): 3 mg via ORAL
  Filled 2019-12-30 (×5): qty 3

## 2019-12-30 MED ORDER — BUPROPION HCL ER (XL) 150 MG PO TB24
150.0000 mg | ORAL_TABLET | Freq: Every day | ORAL | Status: DC
  Administered 2019-12-30 – 2020-01-03 (×5): 150 mg via ORAL
  Filled 2019-12-30 (×5): qty 1

## 2019-12-30 MED ORDER — INSULIN ASPART 100 UNIT/ML ~~LOC~~ SOLN
0.0000 [IU] | Freq: Three times a day (TID) | SUBCUTANEOUS | Status: DC
  Administered 2019-12-31 (×2): 1 [IU] via SUBCUTANEOUS
  Filled 2019-12-30 (×2): qty 1

## 2019-12-30 MED ORDER — ATORVASTATIN CALCIUM 20 MG PO TABS
40.0000 mg | ORAL_TABLET | Freq: Every day | ORAL | Status: DC
  Administered 2019-12-30 – 2020-01-03 (×5): 40 mg via ORAL
  Filled 2019-12-30 (×5): qty 2

## 2019-12-30 MED ORDER — QUETIAPINE FUMARATE 25 MG PO TABS
175.0000 mg | ORAL_TABLET | Freq: Every day | ORAL | Status: DC
  Administered 2019-12-30 – 2020-01-02 (×4): 175 mg via ORAL
  Filled 2019-12-30 (×4): qty 7

## 2019-12-30 MED ORDER — ACETAMINOPHEN 325 MG PO TABS: 650.0000 mg | ORAL_TABLET | Freq: Four times a day (QID) | ORAL | Status: DC | PRN

## 2019-12-30 MED ORDER — SODIUM CHLORIDE 0.9 % IV SOLN
500.0000 mg | Freq: Once | INTRAVENOUS | Status: AC
  Administered 2019-12-30: 500 mg via INTRAVENOUS
  Filled 2019-12-30: qty 500

## 2019-12-30 MED ORDER — LACTATED RINGERS IV SOLN: INTRAVENOUS | Status: AC

## 2019-12-30 MED ORDER — PAROXETINE HCL 20 MG PO TABS
40.0000 mg | ORAL_TABLET | Freq: Every day | ORAL | Status: DC
  Administered 2019-12-30 – 2020-01-03 (×5): 40 mg via ORAL
  Filled 2019-12-30 (×6): qty 2

## 2019-12-30 MED ORDER — METHOTREXATE 25 MG/ML ~~LOC~~ SOSY
0.8000 mL | PREFILLED_SYRINGE | SUBCUTANEOUS | Status: DC
  Filled 2019-12-30: qty 1

## 2019-12-30 MED ORDER — SODIUM CHLORIDE 0.9 % IV SOLN
1.0000 g | INTRAVENOUS | Status: DC
  Administered 2019-12-31 – 2020-01-02 (×3): 1 g via INTRAVENOUS
  Filled 2019-12-30 (×2): qty 10
  Filled 2019-12-30: qty 1

## 2019-12-30 MED ORDER — ENOXAPARIN SODIUM 40 MG/0.4ML ~~LOC~~ SOLN
40.0000 mg | SUBCUTANEOUS | Status: DC
  Administered 2019-12-30 – 2020-01-03 (×5): 40 mg via SUBCUTANEOUS
  Filled 2019-12-30 (×5): qty 0.4

## 2019-12-30 MED ORDER — CARBIDOPA-LEVODOPA 25-100 MG PO TABS
1.0000 | ORAL_TABLET | Freq: Four times a day (QID) | ORAL | Status: DC
  Administered 2019-12-30 – 2020-01-03 (×17): 1 via ORAL
  Filled 2019-12-30 (×22): qty 1

## 2019-12-30 MED ORDER — ASPIRIN EC 81 MG PO TBEC
81.0000 mg | DELAYED_RELEASE_TABLET | Freq: Every day | ORAL | Status: DC
  Administered 2019-12-30 – 2020-01-03 (×5): 81 mg via ORAL
  Filled 2019-12-30 (×5): qty 1

## 2019-12-30 NOTE — ED Notes (Signed)
Pt transported to CT ?

## 2019-12-30 NOTE — ED Notes (Signed)
Pt presents with cough, burning with urination, and some kidney pain on palpation. NAD noted.

## 2019-12-30 NOTE — ED Notes (Addendum)
Date and time results received: 12/30/19 11:27 AM  Test: Lactic Critical Value:4  MD Blaine Hamper notified

## 2019-12-30 NOTE — ED Provider Notes (Addendum)
[facility] Emergency Department Provider Note  ____________________________________________   First MD Initiated Contact with Patient 12/30/19 0533     (approximate)  I have reviewed the triage vital signs and the nursing notes.   HISTORY  Chief Complaint Weakness and Cough    HPI Seth Mcpherson is a 74 y.o. male with history of hypertension, diabetes mellitus Parkinson's disease dementia, prostate cancer and recent hospital admission for sepsis secondary to urinary tract infection on 11/06/2019 with discharge on 11/08/2019 returns to the emergency department secondary to cough, burning with urination, generalized weakness.  While in the waiting room the patient's blood pressure abruptly dropped to 99/66 heart rate increased from 85 to 133.  Patient afebrile however the patient's wife states that he has never been febrile even when he had sepsis.  Patient's wife states that current presentation is consistent with previous episode of sepsis.        Past Medical History:  Diagnosis Date  . Cancer ([place])   . Dementia ([place])   . Diabetes mellitus without complication ([place])   . Hypertension   . Parkinson disease ([place])   . Prostate cancer [facility])     Patient Active Problem List   Diagnosis Date Noted  . Sepsis secondary to UTI ([place]) 11/06/2019  . Chronic diarrhea 08/05/2019  . Closed right acetabular fracture ([place]) 08/05/2019  . Type 2 diabetes mellitus without complication ([place]) 35/00/9381  . Essential hypertension 08/05/2019  . Parkinson disease ([place]) 08/05/2019  . Cognitive deficit due to Parkinson's disease ([place]) 08/05/2019  . Syncope 08/05/2019  . Hypotension 08/05/2019  . AKI (acute kidney injury) ([place]) 08/05/2019  . Acetabular fracture ([place]) 08/05/2019    No past surgical history on file.  Prior to Admission medications   Medication Sig Start Date End Date Taking? Authorizing Provider  Armodafinil 200 MG TABS Take 50 mg by mouth daily.  06/28/19    [provider]  aspirin EC 81 MG tablet Take 81 mg by mouth daily.    [provider]  atorvastatin (LIPITOR) 40 MG tablet Take 40 mg by mouth daily.    [provider]  buPROPion (WELLBUTRIN XL) 150 MG 24 hr tablet Take 150 mg by mouth daily. 10/31/19   [provider]  carbidopa-levodopa (SINEMET IR) 25-100 MG tablet Take 1 tablet by mouth 4 (four) times daily. 11/08/19   Fritzi Mandes, MD  folic acid (FOLVITE) 1 MG tablet Take 3 mg by mouth daily.    [provider]  metFORMIN (GLUCOPHAGE) 1000 MG tablet Take 1,000 mg by mouth 2 (two) times daily with a meal.    [provider]  Methotrexate 25 MG/ML SOSY Inject 0.8 mLs into the skin every Friday.     [provider]  PARoxetine (PAXIL) 40 MG tablet Take 1 tablet (40 mg total) by mouth daily. 08/16/19   Swayze, Ava, DO  QUEtiapine (SEROQUEL) 25 MG tablet Take 50-75 mg by mouth See admin instructions. Take 2 tablets (50mg ) by mouth every morning and take 3 tablets (75mg ) by mouth every night 09/26/19   [provider]  rivastigmine (EXELON) 1.5 MG capsule Take 1 capsule (1.5 mg total) by mouth 2 (two) times daily. 08/15/19   Swayze, Ava, DO  tamsulosin (FLOMAX) 0.4 MG CAPS capsule Take 0.4 mg by mouth daily.     [provider]    Allergies Patient has no known allergies.  No family history on file.  Social History Social History   Tobacco Use  .  Smoking status: Former Research scientist (life sciences)  . Smokeless tobacco: Never Used  Substance Use Topics  . Alcohol use: Not Currently  . Drug use: Not on file    Review of Systems Constitutional: No fever/chills Eyes: No visual changes. ENT: No sore throat. Cardiovascular: Denies chest pain. Respiratory: Positive for cough shortness of breath. Gastrointestinal: No abdominal pain.  No nausea, no vomiting.  No diarrhea.  No constipation. Genitourinary: Positive  for dysuria. Musculoskeletal: Negative for neck pain.  Negative for  back pain. Integumentary: Negative for rash. Neurological: Negative for headaches, focal weakness or numbness.   ____________________________________________   PHYSICAL EXAM:  VITAL SIGNS: ED Triage Vitals  Enc Vitals Group     BP 12/29/19 1930 (!) 104/91     Pulse Rate 12/29/19 1930 96     Resp 12/29/19 1930 18     Temp 12/29/19 1930 98.2 F (36.8 C)     Temp Source 12/29/19 1930 Oral     SpO2 12/29/19 1930 94 %     Weight 12/29/19 1931 63.5 kg (140 lb)     Height 12/29/19 1931 1.829 m (6')     Head Circumference --      Peak Flow --      Pain Score 12/29/19 1937 0     Pain Loc --      Pain Edu? --      Excl. in [place]? --     Constitutional: Alert  Eyes: Conjunctivae are normal.  Head: Atraumatic. Mouth/Throat: Dry oral mucosa Neck: No stridor.  No meningeal signs.   Cardiovascular: Normal rate, regular rhythm. Good peripheral circulation. Grossly normal heart sounds. Respiratory: Normal respiratory effort.  No retractions. Gastrointestinal: Soft and nontender. No distention.  Musculoskeletal: No lower extremity tenderness nor edema. No gross deformities of extremities. Neurologic:  Normal speech and language. No gross focal neurologic deficits are appreciated.  Skin:  Skin is warm, dry and intact. Psychiatric: Mood and affect are normal. Speech and behavior are normal.  ____________________________________________   LABS (all labs ordered are listed, but only abnormal results are displayed)  Labs Reviewed  BASIC METABOLIC PANEL - Abnormal; Notable for the following components:      Result Value   Chloride 97 (*)    Glucose, Bld 111 (*)    All other components within normal limits  CBC - Abnormal; Notable for the following components:   RDW 16.1 (*)    All other components within normal limits  CULTURE, BLOOD (ROUTINE X 2)  CULTURE, BLOOD (ROUTINE X 2)  URINE CULTURE  URINALYSIS, COMPLETE (UACMP) WITH MICROSCOPIC  LACTIC ACID, PLASMA  LACTIC ACID, PLASMA    COMPREHENSIVE METABOLIC PANEL  CBC WITH DIFFERENTIAL/PLATELET  PROTIME-INR  APTT  TROPONIN I (HIGH SENSITIVITY)  TROPONIN I (HIGH SENSITIVITY)     RADIOLOGY I, Jasper N Nansi [place], personally viewed and evaluated these images (plain radiographs) as part of my medical decision making, as well as reviewing the written report by the radiologist.  ED MD interpretation: Elevated left hemidiaphragm with mild left lower lobe atelectasis unchanged from prior study per radiologist chest x-ray impression  Official radiology report(s): DG Chest 2 View  Result Date: 12/29/2019 CLINICAL DATA:  Weakness.  Productive cough and fever. EXAM: CHEST - 2 VIEW COMPARISON:  11/06/2019 FINDINGS: Heart size and vascularity normal.  Negative for heart failure Elevated left hemidiaphragm with distended bowel below the diaphragm similar to the prior study. Mild left lower lobe airspace disease unchanged from the prior study. Probable atelectasis. No definite pneumonia or effusion  on the lateral view. IMPRESSION: Elevated left hemidiaphragm with mild left lower lobe atelectasis unchanged from the prior study. Electronically Signed   By: Franchot Gallo M.D.   On: 12/29/2019 20:10    ____________________________________________   PROCEDURES     .Critical Care Performed by: Gregor Hams, MD Authorized by: Gregor Hams, MD   Critical care provider statement:    Critical care time (minutes):  30   Critical care time was exclusive of:  Separately billable procedures and treating other patients   Critical care was necessary to treat or prevent imminent or life-threatening deterioration of the following conditions:  Sepsis   Critical care was time spent personally by me on the following activities:  Development of treatment plan with patient or surrogate, discussions with consultants, evaluation of patient's response to treatment, examination of patient, obtaining history from patient or surrogate, ordering  and performing treatments and interventions, ordering and review of laboratory studies, ordering and review of radiographic studies, pulse oximetry, re-evaluation of patient's condition and review of old charts     ____________________________________________   INITIAL IMPRESSION / MDM / [place] / ED COURSE  As part of my medical decision making, I reviewed the following data within the electronic MEDICAL RECORD NUMBER   74 year old male presented with above-stated history and physical exam concerning for sepsis as such sepsis protocol was initiated.  Suspect possible UTI versus pneumonia given patient's hypoxic state of 91% on room air and reported dysuria.  Patient given IV ceftriaxone and azithromycin.  Patient also given 2 L IV LR.  Laboratory data revealed a lactic acid of 7.9, white blood cell count of 12.3.  Patient discussed with Dr. Reola Calkins for hospital admission further evaluation and management of sepsis  ____________________________________________  FINAL CLINICAL IMPRESSION(S) / ED DIAGNOSES  Final diagnoses:  Sepsis, due to unspecified organism, unspecified whether acute organ dysfunction present [facility])     MEDICATIONS GIVEN DURING THIS VISIT:  Medications  lactated ringers infusion (has no administration in time range)  lactated ringers bolus 1,000 mL (has no administration in time range)    And  lactated ringers bolus 1,000 mL (has no administration in time range)  cefTRIAXone (ROCEPHIN) 1 g in sodium chloride 0.9 % 100 mL IVPB (has no administration in time range)  azithromycin (ZITHROMAX) 500 mg in sodium chloride 0.9 % 250 mL IVPB (has no administration in time range)     ED Discharge Orders    None      *Please note:  Seth Mcpherson was evaluated in Emergency Department on 12/30/2019 for the symptoms described in the history of present illness. He was evaluated in the context of the global COVID-19 pandemic, which necessitated consideration that the patient  might be at risk for infection with the SARS-CoV-2 virus that causes COVID-19. Institutional protocols and algorithms that pertain to the evaluation of patients at risk for COVID-19 are in a state of rapid change based on information released by regulatory bodies including the CDC and federal and state organizations. These policies and algorithms were followed during the patient's care in the ED.  Some ED evaluations and interventions may be delayed as a result of limited staffing during and after the pandemic.*  Note:  This document was prepared using Dragon voice recognition software and may include unintentional dictation errors.   Gregor Hams, MD 12/30/19 0719    Gregor Hams, MD 12/30/19 [phone]

## 2019-12-30 NOTE — Progress Notes (Signed)
CODE SEPSIS - PHARMACY COMMUNICATION  **Broad Spectrum Antibiotics should be administered within 1 hour of Sepsis diagnosis**  Time Code Sepsis Called/Page Received:  9/24 @ 0525  Antibiotics Ordered: Ceftriaxone, Azithromycin   Time of 1st antibiotic administration: Ceftriaxone 2 gm IV X 1 0545  Additional action taken by pharmacy:   If necessary, Name of Provider/Nurse Contacted:     Keldan Eplin D ,PharmD Clinical Pharmacist  12/30/2019  6:00 AM

## 2019-12-30 NOTE — H&P (Addendum)
History and Physical    Seth Mcpherson EYC:[id number] DOB: [birth year] DOA: 12/30/2019  Referring MD/NP/PA:   PCP: Seth Drought, MD   Patient coming from:  The patient is coming from home.  At baseline, pt is independent for most of ADL.        Chief Complaint: Dysuria and burning on urination, cough  HPI: Seth Mcpherson is a 74 y.o. male with medical history significant of hypertension, hyperlipidemia, diabetes mellitus, depression, prostate cancer, Parkinson's disease, dementia, chronic diarrhea, recent admission due to sepsis 2/2 UTI and pneumonia, who presents with dysuria and burning on urination and cough.  Patient was recently hospitalized from 8/1-8/3 due to sepsis secondary to UTI and possible pneumonia. Urine culture was positive for Proteus mirabilis. Patient was treated with IV Rocephin and azithromycin, and discharged on cefdinir.  Per patient's wife, patient has dysuria, burning on urination, increased urinary frequency in the past several days.  Patient has generalized weakness.  He also has dry cough, mild shortness breath, no chest pain.  No fever or chills.  Patient has history of IBS, with chronic intermittent mild diarrhea, which has not changed.  Today patient does not have nausea, vomiting, diarrhea or abdominal pain.  No facial droop or slurred speech. Wife states that pt is taking methotrexate for pemphigoid.  ED Course: pt was found to have WBC 12.3, urinalysis (hazy appearance, small amount of leukocyte, rare bacteria, WBC 21-50), negative COVID-19 PCR, lactic acid 7.9, INR 1.0, PTT 32, troponin level 3, AKI with creatinine 1.42, BUN 32, temperature normal, blood pressure 99/66 --> 127/77, tachycardia with heart rate of 133, RR 17, oxygen saturation 88% on room air, which improved to 99% on 2 L oxygen. Chest x-ray showed elevated left hemidiaphragm with mild left lower lobe atelectasis which is unchanged from the prior study. Pt is admitted to [facility] bed as  inpatient.  CT of chest: 1. Linear atelectasis in the right lower lobe. Otherwise, no acute cardiopulmonary abnormality. 2. Multiple pulmonary nodules, measuring up to 6 mm. Non-contrast chest CT at 3-6 months is recommended. If the nodules are stable at time of repeat CT, then future CT at 18-24 months (from today's scan) is considered optional for low-risk patients, but is recommended for high-risk patients. This recommendation follows the consensus statement: Guidelines for Management of Incidental Pulmonary Nodules Detected on CT Images: From the Fleischner Society 2017; Radiology 2017; 284:228-243.    Review of Systems:   General: no fevers, chills, no body weight gain,  has fatigue HEENT: no blurry vision, hearing changes or sore throat Respiratory: has mild dyspnea, coughing, no wheezing CV: no chest pain, no palpitations GI: no nausea, vomiting, abdominal pain, has diarrhea, no constipation GU: has dysuria, burning on urination, increased urinary frequency, no hematuria  Ext: no leg edema Neuro: no unilateral weakness, numbness, or tingling, no vision change or hearing loss Skin: no rash, no skin tear. MSK: No muscle spasm, no deformity, no limitation of range of movement in spin Heme: No easy bruising.  Travel history: No recent long distant travel.  Allergy: No Known Allergies  Past Medical History:  Diagnosis Date  . Cancer ([place])   . Dementia ([place])   . Diabetes mellitus without complication ([place])   . Hypertension   . Parkinson disease ([place])   . Prostate cancer [facility])     Past Surgical History:  Procedure Laterality Date  . CLOSED REDUCTION CLAVICLE FRACTURE      Social History:  reports that he has quit smoking. He has never  used smokeless tobacco. He reports previous alcohol use. No history on file for drug use.  Family History:  Family History  Problem Relation Age of Onset  . Hypertension Mother   . Hypertension Father   . Lymphoma Sister       Prior to Admission medications   Medication Sig Start Date End Date Taking? Authorizing Provider  Armodafinil 200 MG TABS Take 50 mg by mouth daily.  06/28/19   [provider]  aspirin EC 81 MG tablet Take 81 mg by mouth daily.    [provider]  atorvastatin (LIPITOR) 40 MG tablet Take 40 mg by mouth daily.    [provider]  buPROPion (WELLBUTRIN XL) 150 MG 24 hr tablet Take 150 mg by mouth daily. 10/31/19   [provider]  carbidopa-levodopa (SINEMET IR) 25-100 MG tablet Take 1 tablet by mouth 4 (four) times daily. 11/08/19   Seth Mandes, MD  folic acid (FOLVITE) 1 MG tablet Take 3 mg by mouth daily.    [provider]  metFORMIN (GLUCOPHAGE) 1000 MG tablet Take 1,000 mg by mouth 2 (two) times daily with a meal.    [provider]  Methotrexate 25 MG/ML SOSY Inject 0.8 mLs into the skin every Friday.     [provider]  PARoxetine (PAXIL) 40 MG tablet Take 1 tablet (40 mg total) by mouth daily. 08/16/19   Mcpherson, Ava, DO  QUEtiapine (SEROQUEL) 25 MG tablet Take 50-75 mg by mouth See admin instructions. Take 2 tablets (50mg ) by mouth every morning and take 3 tablets (75mg ) by mouth every night 09/26/19   [provider]  rivastigmine (EXELON) 1.5 MG capsule Take 1 capsule (1.5 mg total) by mouth 2 (two) times daily. 08/15/19   Mcpherson, Ava, DO  tamsulosin (FLOMAX) 0.4 MG CAPS capsule Take 0.4 mg by mouth daily.     [provider]    Physical Exam: Vitals:   12/30/19 0532 12/30/19 0549 12/30/19 0600 12/30/19 0630  BP:   133/88 127/77  Pulse:   94 90  Resp:   19 17  Temp:      TempSrc:      SpO2: (!) 88% 95% 97% 99%  Weight:      Height:       General: Not in acute distress HEENT:       Eyes: PERRL, EOMI, no scleral icterus.       ENT: No discharge from the ears and nose, no pharynx injection, no tonsillar enlargement.        Neck: No JVD, no bruit, no mass felt. Heme: No neck lymph node  enlargement. Cardiac: S1/S2, RRR, No murmurs, No gallops or rubs. Respiratory: No rales, wheezing, rhonchi or rubs. GI: Soft, nondistended, nontender, no rebound pain, no organomegaly, BS present. GU: No hematuria Ext: No pitting leg edema bilaterally. 1+DP/PT pulse bilaterally. Musculoskeletal: No joint deformities, No joint redness or warmth, no limitation of ROM in spin. Skin: No rashes.  Neuro: Alert, following command, cranial nerves II-XII grossly intact, moves all extremities normally.  Psych: Patient is not psychotic, no suicidal or hemocidal ideation.  Labs on Admission: I have personally reviewed following labs and imaging studies  CBC: Recent Labs  Lab 12/29/19 1931 12/30/19 0542  WBC 7.6 12.3*  NEUTROABS  --  10.9*  HGB 13.6 14.7  HCT 42.0 44.1  MCV 89.9 87.7  PLT 232 030   Basic Metabolic Panel: Recent Labs  Lab 12/29/19 1931 12/30/19 0542  NA 138 139  K 4.3 4.7  CL 97* 100  CO2 26 18*  GLUCOSE 111* 157*  BUN 23 32*  CREATININE 0.88 1.42*  CALCIUM 9.2 9.9   GFR: Estimated Creatinine Clearance: 40.4 mL/min (A) (by C-G formula based on SCr of 1.42 mg/dL (H)). Liver Function Tests: Recent Labs  Lab 12/30/19 0542  AST 27  ALT 15  ALKPHOS 110  BILITOT 1.3*  PROT 8.2*  ALBUMIN 4.8   No results for input(s): LIPASE, AMYLASE in the last 168 hours. No results for input(s): AMMONIA in the last 168 hours. Coagulation Profile: Recent Labs  Lab 12/30/19 0542  INR 1.0   Cardiac Enzymes: No results for input(s): CKTOTAL, CKMB, CKMBINDEX, TROPONINI in the last 168 hours. BNP (last 3 results) No results for input(s): PROBNP in the last 8760 hours. HbA1C: No results for input(s): HGBA1C in the last 72 hours. CBG: No results for input(s): GLUCAP in the last 168 hours. Lipid Profile: No results for input(s): CHOL, HDL, LDLCALC, TRIG, CHOLHDL, LDLDIRECT in the last 72 hours. Thyroid Function Tests: No results for input(s): TSH, T4TOTAL, FREET4, T3FREE,  THYROIDAB in the last 72 hours. Anemia Panel: No results for input(s): VITAMINB12, FOLATE, FERRITIN, TIBC, IRON, RETICCTPCT in the last 72 hours. Urine analysis:    Component Value Date/Time   COLORURINE YELLOW (A) 12/30/2019 0551   APPEARANCEUR HAZY (A) 12/30/2019 0551   LABSPEC 1.025 12/30/2019 0551   PHURINE 5.0 12/30/2019 0551   GLUCOSEU NEGATIVE 12/30/2019 0551   HGBUR NEGATIVE 12/30/2019 0551   BILIRUBINUR NEGATIVE 12/30/2019 0551   KETONESUR 5 (A) 12/30/2019 0551   PROTEINUR 30 (A) 12/30/2019 0551   NITRITE NEGATIVE 12/30/2019 0551   LEUKOCYTESUR SMALL (A) 12/30/2019 0551   Sepsis Labs: @LABRCNTIP (procalcitonin:4,lacticidven:4) ) Recent Results (from the past 240 hour(s))  Blood Culture (routine x 2)     Status: None (Preliminary result)   Collection Time: 12/30/19  5:31 AM   Specimen: BLOOD  Result Value Ref Range Status   Specimen Description BLOOD BLOOD LEFT FOREARM  Final   Special Requests   Final    BOTTLES DRAWN AEROBIC AND ANAEROBIC Blood Culture adequate volume   Culture   Final    NO GROWTH <12 HOURS Performed at [facility], [address], [place], [place] [address]    Report Status PENDING  Incomplete  Respiratory Panel by RT PCR (Flu A&B, Covid) - Nasopharyngeal Swab     Status: None   Collection Time: 12/30/19  5:31 AM   Specimen: Nasopharyngeal Swab  Result Value Ref Range Status   SARS Coronavirus 2 by RT PCR NEGATIVE NEGATIVE Final    Comment: (NOTE) SARS-CoV-2 target nucleic acids are NOT DETECTED.  The SARS-CoV-2 RNA is generally detectable in upper respiratoy specimens during the acute phase of infection. The lowest concentration of SARS-CoV-2 viral copies this assay can detect is 131 copies/mL. A negative result does not preclude SARS-Cov-2 infection and should not be used as the sole basis for treatment or other patient management decisions. A negative result may occur with  improper specimen collection/handling, submission of  specimen other than nasopharyngeal swab, presence of viral mutation(s) within the areas targeted by this assay, and inadequate number of viral copies (<131 copies/mL). A negative result must be combined with clinical observations, patient history, and epidemiological information. The expected result is Negative.  Fact Sheet for Patients:  PinkCheek.be  Fact Sheet for Healthcare Providers:  GravelBags.it  This test is no t yet approved or cleared by the [place] and  has been authorized for detection and/or diagnosis of SARS-CoV-2 by FDA under an Emergency Use Authorization (EUA). This EUA will remain  in effect (meaning this test can be used) for the duration of the COVID-19 declaration under Section 564(b)(1) of the Act, 21 U.S.C. section 360bbb-3(b)(1), unless the authorization is terminated or revoked sooner.     Influenza A by PCR NEGATIVE NEGATIVE Final   Influenza B by PCR NEGATIVE NEGATIVE Final    Comment: (NOTE) The Xpert Xpress SARS-CoV-2/FLU/RSV assay is intended as an aid in  the diagnosis of influenza from Nasopharyngeal swab specimens and  should not be used as a sole basis for treatment. Nasal washings and  aspirates are unacceptable for Xpert Xpress SARS-CoV-2/FLU/RSV  testing.  Fact Sheet for Patients: PinkCheek.be  Fact Sheet for Healthcare Providers: GravelBags.it  This test is not yet approved or cleared by the [place] FDA and  has been authorized for detection and/or diagnosis of SARS-CoV-2 by  FDA under an Emergency Use Authorization (EUA). This EUA will remain  in effect (meaning this test can be used) for the duration of the  Covid-19 declaration under Section 564(b)(1) of the Act, 21  U.S.C. section 360bbb-3(b)(1), unless the authorization is  terminated or revoked. Performed at [facility], [address] [place], [place] [address]   Blood Culture (routine x 2)     Status: None (Preliminary result)   Collection Time: 12/30/19  5:33 AM   Specimen: BLOOD  Result Value Ref Range Status   Specimen Description BLOOD LEFT ANTECUBITAL  Final   Special Requests   Final    BOTTLES DRAWN AEROBIC AND ANAEROBIC Blood Culture results may not be optimal due to an excessive volume of blood received in culture bottles   Culture   Final    NO GROWTH <12 HOURS Performed at [facility], [address], [place], [place] [address]    Report Status PENDING  Incomplete     Radiological Exams on Admission: DG Chest 2 View  Result Date: 12/29/2019 CLINICAL DATA:  Weakness.  Productive cough and fever. EXAM: CHEST - 2 VIEW COMPARISON:  11/06/2019 FINDINGS: Heart size and vascularity normal.  Negative for heart failure Elevated left hemidiaphragm with distended bowel below the diaphragm similar to the prior study. Mild left lower lobe airspace disease unchanged from the prior study. Probable atelectasis. No definite pneumonia or effusion on the lateral view. IMPRESSION: Elevated left hemidiaphragm with mild left lower lobe atelectasis unchanged from the prior study. Electronically Signed   By: Franchot Gallo M.D.   On: 12/29/2019 20:10   CT Chest Wo Contrast  Result Date: 12/30/2019 CLINICAL DATA:  Pneumonia, effusion, or abscess suspected. EXAM: CT CHEST WITHOUT CONTRAST TECHNIQUE: Multidetector CT imaging of the chest was performed following the standard protocol without IV contrast. COMPARISON:  Chest radiographs 04/27/2019 and 06/03/2019 FINDINGS: Cardiovascular: No significant vascular findings. Normal heart size. No pericardial effusion. Coronary artery and aortic atherosclerosis. Mediastinum/Nodes: No enlarged mediastinal or axillary lymph nodes. Thyroid gland and trachea demonstrate no significant findings. Small hiatal hernia. Lungs/Pleura: No consolidation. No pleural effusion. No pneumothorax. Mild  scarring/fibrotic change in the right upper lobe and linear atelectasis.There is a 5 mm pulmonary nodule in the right upper lobe (series 3, image 46) there is an approximately 6 mm pulmonary nodule in the left upper lobe (series 3, image 30) in the right upper lobe. Irregular 4 mm nodule in the left upper lobe (image 21). Upper Abdomen: Partially imaged left renal cyst. Musculoskeletal:  Remote compression fracture in the lower thoracic spine. Multilevel degenerative change of the spine. Dextrocurvature. IMPRESSION: 1. Linear atelectasis in the right lower lobe. Otherwise, no acute cardiopulmonary abnormality. 2. Multiple pulmonary nodules, measuring up to 6 mm. Non-contrast chest CT at 3-6 months is recommended. If the nodules are stable at time of repeat CT, then future CT at 18-24 months (from today's scan) is considered optional for low-risk patients, but is recommended for high-risk patients. This recommendation follows the consensus statement: Guidelines for Management of Incidental Pulmonary Nodules Detected on CT Images: From the Fleischner Society 2017; Radiology 2017; 284:228-243. Electronically Signed   By: Margaretha Sheffield MD   On: 12/30/2019 07:33     EKG: Independently reviewed. Sinus rhythm, QTC 433, low voltage, LAE, early R wave progression   Assessment/Plan Principal Problem:   UTI (urinary tract infection) Active Problems:   Type 2 diabetes mellitus without complication (HCC)   Essential hypertension   Parkinson disease (HCC)   AKI (acute kidney injury) (HCC)   Severe sepsis with septic shock (HCC)   HLD (hyperlipidemia)   Depression   Dementia (HCC)   Prostate cancer (HCC)   Cough   Pulmonary nodules  Severe sepsis with septic shock due to UTI: Patient has leukocytosis with WBC 12.3, tachycardia with heart rate 133, elevated lactic acid 7.9 and AKI, meets criteria for severe sepsis with septic shock.  Urinalysis positive for UTI.  Patient had positive culture for Proteus  mirabilis recently.   - Admit to med-surg bed as an inpatient -  Ceftriaxone by IV - Follow up results of urine and blood cx and amend antibiotic regimen if needed per sensitivity results - prn Zofran for nausea - will get Procalcitonin and trend lactic acid levels per sepsis protocol. - IVF: 2L of NS bolus in ED, followed by 125 cc/h of LR  Type 2 diabetes mellitus without complication ([place]): Recent A1c 5.8, well controlled.  Patient taking Metformin at home -SSI  Essential hypertension: Blood pressures are soft -Hold blood pressure medications  Parkinson disease ([place]) -Sinemet  AKI (acute kidney injury) ([place]): Likely due to UTI -f/u by BMP -IV fluid as above  HLD (hyperlipidemia): -Lipitor  Depression -Continue home medications  Dementia (HCC) -Rivastigmine and Namenda  Prostate cancer [facility]): Per patient's wife, patient did not have surgery, currently is not treated for prostate cancer. -Continue Flomax, will start tomorrow due to soft blood pressure. -Need to follow-up with urology  Cough: Etiology is not clear.  Chest x-ray and CT of chest did not show pneumonia.  Patient has hx of Parkinson's disease, will need SLP evaluation to r/o aspiration. -As needed Mucinex for cough -As needed albuterol inhaler for shortness breath -Get SLP  Pulmonary nodules: Patient has multiple pulmonary nodules by CT of chest -f/u with PCP         DVT ppx: SQ Lovenox Code Status: DNR per his wife Family Communication: Yes, patient's wife at bed side Disposition Plan:  Anticipate discharge back to previous environment Consults called:  none Admission status: Med-surg bed as inpt     Status is: Inpatient  Remains inpatient appropriate because:Inpatient level of care appropriate due to severity of illness. Patient has multiple comorbidities, now presents with severe sepsis with septic shock secondary to UTI. Lactic acid is elevated to 7.9. Patient also has AKI. His presentation  is highly complicated. Patient is at high risk of deteriorating. Patient will need to be treated in hospital for at least 2 days.   Dispo: The patient  is from: Home              Anticipated d/c is to: Home              Anticipated d/c date is: 2 days              Patient currently is not medically stable to d/c.           Date of Service 12/30/2019    Ivor Costa [facility]   If 7PM-7AM, please contact night-coverage [email or web address] 12/30/2019, 8:22 AM

## 2019-12-30 NOTE — Progress Notes (Signed)
SLP Cancellation Note  Patient Details Name: Islam Eichinger MRN: [id number] DOB: 1945/06/13   Cancelled treatment:       Reason Eval/Treat Not Completed: SLP screened, no needs identified, will sign off   Chart review, nursing consulted and pt continues to consume currently prescribed diet without any overt s/s of aspiration or dysphagia. Chest x-ray is clear.   ST to sign off at this time.   Lauranne Beyersdorf B. Rutherford Nail M.S., CCC-SLP, [place] Office [phone]    Stormy Fabian 12/30/2019, 11:43 AM

## 2019-12-31 DIAGNOSIS — N3000 Acute cystitis without hematuria: Secondary | ICD-10-CM

## 2019-12-31 LAB — BASIC METABOLIC PANEL
Anion gap: 8 (ref 5–15)
BUN: 19 mg/dL (ref 8–23)
CO2: 29 mmol/L (ref 22–32)
Calcium: 8.6 mg/dL — ABNORMAL LOW (ref 8.9–10.3)
Chloride: 100 mmol/L (ref 98–111)
Creatinine, Ser: 0.85 mg/dL (ref 0.61–1.24)
GFR calc Af Amer: >60 mL/min (ref >60)
GFR calc non Af Amer: >60 mL/min (ref >60)
Glucose, Bld: 94 mg/dL (ref 70–99)
Potassium: 3.7 mmol/L (ref 3.5–5.1)
Sodium: 137 mmol/L (ref 135–145)

## 2019-12-31 LAB — CBC
HCT: 32.8 % — ABNORMAL LOW (ref 39.0–52.0)
Hemoglobin: 11.1 g/dL — ABNORMAL LOW (ref 13.0–17.0)
MCH: 29.3 pg (ref 26.0–34.0)
MCHC: 33.8 g/dL (ref 30.0–36.0)
MCV: 86.5 fL (ref 80.0–100.0)
Platelets: 186 10*3/uL (ref 150–400)
RBC: 3.79 MIL/uL — ABNORMAL LOW (ref 4.22–5.81)
RDW: 15.9 % — ABNORMAL HIGH (ref 11.5–15.5)
WBC: 7.1 10*3/uL (ref 4.0–10.5)
nRBC: 0 % (ref 0.0–0.2)

## 2019-12-31 LAB — URINE CULTURE

## 2019-12-31 LAB — GLUCOSE, CAPILLARY
Glucose-Capillary: 84 mg/dL (ref 70–99)
Glucose-Capillary: 146 mg/dL — ABNORMAL HIGH (ref 70–99)
Glucose-Capillary: 135 mg/dL — ABNORMAL HIGH (ref 70–99)
Glucose-Capillary: 111 mg/dL — ABNORMAL HIGH (ref 70–99)

## 2019-12-31 MED ORDER — LACTATED RINGERS IV SOLN: INTRAVENOUS | Status: AC

## 2019-12-31 NOTE — Evaluation (Signed)
Occupational Therapy Evaluation Patient Details Name: Seth Mcpherson MRN: [id number] DOB: 1946-03-27 Today's Date: 12/31/2019    History of Present Illness Seth Mcpherson is a 74 y.o. male with medical history significant of hypertension, hyperlipidemia, diabetes mellitus, depression, prostate cancer, Parkinson's disease, dementia, chronic diarrhea, recent admission due to sepsis 2/2 UTI and pneumonia, who presents with dysuria and burning on urination and cough. Pt admitted for severe sepsis.   Clinical Impression   Seth Mcpherson was seen for OT evaluation this date. Prior to hospital admission, pt was generally dependent for BADL management. His wife at bedside reports he required assistance for most aspects of ADL management since his acetabular fracture in April 2021. Per spouse, pt is able to get OOB with home health physical therapy 1-2x weekly, but otherwise required at least some bed-level assist for ADL management including toileting, bathing, and self-feeding. Pt states he would like to regain some strength and independence so he can return to his preferred hobbies of reading, bird watching, and going out for ice cream. Currently pt demonstrates impairments as described below (See OT problem list) which functionally limit his ability to perform ADL/self-care tasks. Pt currently requires MOD/MAX A for bed mobility, MAX A +2 for functional mobility, and MAX A for bed level bathing, dressing, and grooming,  Pt would benefit from skilled OT services to address noted impairments and functional limitations (see below for any additional details) in order to maximize safety and independence while minimizing falls risk and caregiver burden. Upon hospital discharge, recommend STR to maximize pt safety and return to PLOF.      Follow Up Recommendations  SNF;Supervision/Assistance - 24 hour    Equipment Recommendations  3 in 1 bedside commode    Recommendations for Other Services       Precautions /  Restrictions Precautions Precautions: Fall Required Braces or Orthoses: Other Brace Other Brace: Pt wears R lift shoe for all mobility. Restrictions Weight Bearing Restrictions: No      Mobility Bed Mobility Overal bed mobility: Needs Assistance Bed Mobility: Supine to Sit;Sit to Supine;Rolling Rolling: Max assist;+2 for physical assistance   Supine to sit: Mod assist Sit to supine: Max assist   General bed mobility comments: Poor coordination of motor tasks including rolling, reaching for bed rails, etc. Requires significant prompting to participate in mobility this date. MAX A for bed boost.  Transfers                 General transfer comment: Deferred for pt safety. Pt does not have lift shoe required for mobility attempts. Spouse voices plans to bring it in next date.    Balance Overall balance assessment: Needs assistance Sitting-balance support: Feet unsupported;Bilateral upper extremity supported Sitting balance-Leahy Scale: Poor Sitting balance - Comments: Pt with significant posterior lean and decreased ability to maintain static sitting balance at EOB. Requires variable assist min-mod A t/o seated tasks. Postural control: Posterior lean     Standing balance comment: Not tested.                           ADL either performed or assessed with clinical judgement   ADL Overall ADL's : Needs assistance/impaired                                     Functional mobility during ADLs: Moderate assistance;Maximal assistance;+2 for physical assistance;Rolling walker General ADL Comments:  Pt significantly functionally limited by generalized weakness, tremors, decreased functional use of his R UE, impaired cognition, and decreased activity tolerance. He requires MAX A to come to sitting at EOB, with poor sitting balance. Anticipate MAX-TOTAL A for LB dressing and bathing with +2 assist to maintain seated balance and assist with task.     Vision          Perception     Praxis      Pertinent Vitals/Pain Pain Assessment: Faces Faces Pain Scale: Hurts little more Pain Location: R hip, low back, chronic Pain Descriptors / Indicators: Sore;Discomfort Pain Intervention(s): Limited activity within patient's tolerance;Monitored during session;Repositioned     Hand Dominance Right   Extremity/Trunk Assessment Upper Extremity Assessment Upper Extremity Assessment: Generalized weakness (BUE grossly 3 to 3+/5 t/o with tremoring noted in BUE. Grip strength WFL 4/5 in BUE. 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 Pt wears lift shoe, but does not have it at time of OT evaluation.       Communication Communication Communication: Other (comment) (soft voice)   Cognition Arousal/Alertness: Awake/alert Behavior During Therapy: WFL for tasks assessed/performed Overall Cognitive Status: History of cognitive impairments - at baseline                                 General Comments: Pt oriented to self only, is unable to state birthdate. Per spouse at bedside, he would typically be able to state his birthday and may still be mildly AMS. Pt demos limited ability to follow VCs, but is able to follow simple 1-step VCs with increased time and prompting.   General Comments       Exercises Other Exercises Other Exercises: Pt/caregivers (spouse and daughter at bedside) educated on role of OT in acute setting, bed mobility techniques, and routines modifications to support safety and functional independence upon hospital DC. Other Exercises: OT facilitates bed mobility from sup<>sit, upon return to supine, pt noted  to be soiled with urine, so OT faiciltates bed level clean-up/linen change with spouse eager to provide +2 assist this date.   Shoulder Instructions      Home Living Family/patient expects to be discharged to:: Skilled nursing facility (Per pt spouse, pt was in process to going to [facility]) Living Arrangements: Spouse/significant other                                      Prior Functioning/Environment Level of Independence: Needs assistance    ADL's / Homemaking Assistance Needed: Pt spouse reports he is only up with physical therapy ~2x/week. Otherwise has been bedbound and requires assist for all aspects of ADL/IADL management. PCA/HH services in weekly to assist with bathing, etc. Wife assists with feeding, grooming, toileting at bed level.            OT Problem List: Decreased strength;Decreased coordination;Decreased range of motion;Decreased cognition;Decreased activity tolerance;Decreased safety awareness;Impaired balance (sitting and/or standing);Decreased knowledge of use of DME or AE;Impaired UE functional use      OT Treatment/Interventions: Self-care/ADL training;Therapeutic exercise;Therapeutic activities;DME and/or AE instruction;Patient/family education;Balance training;Energy conservation  OT Goals(Current goals can be found in the care plan section) Acute Rehab OT Goals Patient Stated Goal: To get stronger so I can get back to birdwatching OT Goal Formulation: With patient/family Time For Goal Achievement: 01/14/20 Potential to Achieve Goals: Good  OT Frequency: Min 1X/week   Barriers to D/C: Decreased caregiver support;Inaccessible home environment          Co-evaluation              AM-PAC OT "6 Clicks" Daily Activity     Outcome Measure Help from another person eating meals?: A Little Help from another person taking care of personal grooming?: A Lot Help from another person toileting, which includes using toliet, bedpan,  or urinal?: A Lot Help from another person bathing (including washing, rinsing, drying)?: A Lot Help from another person to put on and taking off regular upper body clothing?: A Lot Help from another person to put on and taking off regular lower body clothing?: A Lot 6 Click Score: 13   End of Session Nurse Communication: Mobility status  Activity Tolerance: Patient tolerated treatment well Patient left: in bed;with call bell/phone within reach;with bed alarm set;with family/visitor present  OT Visit Diagnosis: Other abnormalities of gait and mobility (R26.89);Muscle weakness (generalized) (M62.81)                Time: 8421-0312 OT Time Calculation (min): 31 min Charges:  OT General Charges $OT Visit: 1 Visit OT Evaluation $OT Eval Moderate Complexity: 1 Mod OT Treatments $Self Care/Home Management : 8-22 mins  Shara Blazing, M.S., OTR/L Ascom: [phone] 12/31/19, 2:22 PM

## 2019-12-31 NOTE — Progress Notes (Signed)
PT Cancellation Note  Patient Details Name: Seth Mcpherson MRN: [id number] DOB: [birth year]   Cancelled Treatment:    Reason Eval/Treat Not Completed: Patient at procedure or test/unavailable   Alanson Puls, PT DPT 12/31/2019, 3:41 PM

## 2019-12-31 NOTE — Progress Notes (Signed)
PROGRESS NOTE    Seth Mcpherson  OVZ:[id number] DOB: February 20, 1946 DOA: 12/30/2019 PCP: Marygrace Drought, MD    Brief Narrative:  HPI: Seth Mcpherson is a 74 y.o. male with medical history significant of hypertension, hyperlipidemia, diabetes mellitus, depression, prostate cancer, Parkinson's disease, dementia, chronic diarrhea, recent admission due to sepsis 2/2 UTI and pneumonia, who presents with dysuria and burning on urination and cough.  Patient was recently hospitalized from 8/1-8/3 due to sepsis secondary to UTI and possible pneumonia. Urine culture was positive for Proteus mirabilis. Patient was treated with IV Rocephin and azithromycin, and discharged on cefdinir.  Per patient's wife, patient has dysuria, burning on urination, increased urinary frequency in the past several days.  Patient has generalized weakness.  He also has dry cough, mild shortness breath, no chest pain.  No fever or chills.  Patient has history of IBS, with chronic intermittent mild diarrhea, which has not changed.  Today patient does not have nausea, vomiting, diarrhea or abdominal pain.  No facial droop or slurred speech. Wife states that pt is taking methotrexate for pemphigoid.   Assessment & Plan:   Principal Problem:   UTI (urinary tract infection) Active Problems:   Type 2 diabetes mellitus without complication (HCC)   Essential hypertension   Parkinson disease (HCC)   AKI (acute kidney injury) ([place])   Severe sepsis with septic shock (HCC)   HLD (hyperlipidemia)   Depression   Dementia (HCC)   Prostate cancer (HCC)   Cough   Pulmonary nodules  Severe sepsis due to UTI Shock physiology resolved  Patient has leukocytosis with WBC 12.3, tachycardia with heart rate 133, elevated lactic acid 7.9 and AKI, meets criteria for severe sepsis.  Blood pressure responded to fluid resuscitation.  Shock physiology resolved  Urinalysis positive for UTI.   Patient had positive culture for Proteus mirabilis  recently. Clinically appears improved over interval Plan: Continue Rocephin Follow cultures Antiemetics as needed Maintenance IV fluids   Type 2 diabetes mellitus without complication (HCC)  Recent A1c 5.8, well controlled.  Patient taking Metformin at home -SSI  Essential hypertension: Blood pressures are soft -Hold blood pressure medications  Parkinson disease ([place]) -Sinemet  AKI (acute kidney injury) ([place]), resolved  Likely due to UTI -f/u by BMP -IV fluid as above  HLD (hyperlipidemia): -Lipitor  Depression -Continue home medications  Dementia ([place]) -Rivastigmine and Namenda  Prostate cancer [facility]): Per patient's wife, patient did not have surgery, currently is not treated for prostate cancer. -Continue Flomax, will start tomorrow due to soft blood pressure. -Need to follow-up with urology  Cough: Etiology is not clear.  Chest x-ray and CT of chest did not show pneumonia.  Patient has hx of Parkinson's disease, will need SLP evaluation to r/o aspiration.  SLP evaluation complete.  No swallowing issues -As needed Mucinex for cough -As needed albuterol inhaler for shortness breath  Pulmonary nodules: Patient has multiple pulmonary nodules by CT of chest -f/u with PCP   DVT prophylaxis: SQ Lovenox Code Status: DNR Family Communication: Wife [name] [phone] on 9/25 Disposition Plan: Status is: Inpatient  Remains inpatient appropriate because:Inpatient level of care appropriate due to severity of illness   Dispo: The patient is from: Home              Anticipated d/c is to: Home              Anticipated d/c date is: 2 days              Patient currently  is not medically stable to d/c.  Resolving severe sepsis secondary to UTI.  Continuing on empiric IV antibiotics.  Anticipate 2 additional days of inpatient treatment monitoring.  Disposition may be a challenge.  Wife states she is unable to care for patient at home.  Consultants:    None  Procedures:   None  Antimicrobials:   Ceftriaxone   Subjective: Seen and examined.  Endorsing some weakness and lethargy.  No pain complaints.  Objective: Vitals:   12/30/19 2211 12/31/19 0504 12/31/19 0752 12/31/19 1141  BP: 107/61 105/64 103/62 110/70  Pulse: 72 64 64 70  Resp: 18 16 16 20   Temp: 98 F (36.7 C) 97.8 F (36.6 C) 98 F (36.7 C) 98.2 F (36.8 C)  TempSrc: Oral Oral Oral Oral  SpO2: 95% 96% 95% 96%  Weight:      Height:        Intake/Output Summary (Last 24 hours) at 12/31/2019 1141 Last data filed at 12/31/2019 0643 Gross per 24 hour  Intake 87.85 ml  Output --  Net 87.85 ml   Filed Weights   12/29/19 1931  Weight: 63.5 kg    Examination:  General exam: No acute distress, appears frail Respiratory system: Clear to auscultation.  Normal work of breathing.  Room air Cardiovascular system: S1-S2 heard, no murmurs, no pedal edema Gastrointestinal system: Tender to palpation suprapubic region, nondistended, positive bowel sounds Central nervous system: Alert and oriented.  Resting tremor Extremities: Diffusely decreased power Skin: Pale, scattered excoriations Psychiatry: Judgement and insight appear normal.  Affect flattened    Data Reviewed: I have personally reviewed following labs and imaging studies  CBC: Recent Labs  Lab 12/29/19 1931 12/30/19 0542 12/31/19 0613  WBC 7.6 12.3* 7.1  NEUTROABS  --  10.9*  --   HGB 13.6 14.7 11.1*  HCT 42.0 44.1 32.8*  MCV 89.9 87.7 86.5  PLT 232 257 638   Basic Metabolic Panel: Recent Labs  Lab 12/29/19 1931 12/30/19 0542 12/31/19 0613  NA 138 139 137  K 4.3 4.7 3.7  CL 97* 100 100  CO2 26 18* 29  GLUCOSE 111* 157* 94  BUN 23 32* 19  CREATININE 0.88 1.42* 0.85  CALCIUM 9.2 9.9 8.6*   GFR: Estimated Creatinine Clearance: 67.4 mL/min (by C-G formula based on SCr of 0.85 mg/dL). Liver Function Tests: Recent Labs  Lab 12/30/19 0542  AST 27  ALT 15  ALKPHOS 110  BILITOT  1.3*  PROT 8.2*  ALBUMIN 4.8   No results for input(s): LIPASE, AMYLASE in the last 168 hours. No results for input(s): AMMONIA in the last 168 hours. Coagulation Profile: Recent Labs  Lab 12/30/19 0542  INR 1.0   Cardiac Enzymes: No results for input(s): CKTOTAL, CKMB, CKMBINDEX, TROPONINI in the last 168 hours. BNP (last 3 results) No results for input(s): PROBNP in the last 8760 hours. HbA1C: No results for input(s): HGBA1C in the last 72 hours. CBG: Recent Labs  Lab 12/30/19 1217 12/30/19 1737 12/30/19 2116 12/31/19 0744  GLUCAP 82 117* 105* 84   Lipid Profile: No results for input(s): CHOL, HDL, LDLCALC, TRIG, CHOLHDL, LDLDIRECT in the last 72 hours. Thyroid Function Tests: No results for input(s): TSH, T4TOTAL, FREET4, T3FREE, THYROIDAB in the last 72 hours. Anemia Panel: No results for input(s): VITAMINB12, FOLATE, FERRITIN, TIBC, IRON, RETICCTPCT in the last 72 hours. Sepsis Labs: Recent Labs  Lab 12/30/19 0533 12/30/19 0849 12/30/19 1039  PROCALCITON  --  <0.10  --   LATICACIDVEN 7.9* 1.5 4.0*  Recent Results (from the past 240 hour(s))  Blood Culture (routine x 2)     Status: None (Preliminary result)   Collection Time: 12/30/19  5:31 AM   Specimen: BLOOD  Result Value Ref Range Status   Specimen Description BLOOD BLOOD LEFT FOREARM  Final   Special Requests   Final    BOTTLES DRAWN AEROBIC AND ANAEROBIC Blood Culture adequate volume   Culture   Final    NO GROWTH < 24 HOURS Performed at [facility], [address], [place], [place] [address]    Report Status PENDING  Incomplete  Respiratory Panel by RT PCR (Flu A&B, Covid) - Nasopharyngeal Swab     Status: None   Collection Time: 12/30/19  5:31 AM   Specimen: Nasopharyngeal Swab  Result Value Ref Range Status   SARS Coronavirus 2 by RT PCR NEGATIVE NEGATIVE Final    Comment: (NOTE) SARS-CoV-2 target nucleic acids are NOT DETECTED.  The SARS-CoV-2 RNA is generally detectable in  upper respiratoy specimens during the acute phase of infection. The lowest concentration of SARS-CoV-2 viral copies this assay can detect is 131 copies/mL. A negative result does not preclude SARS-Cov-2 infection and should not be used as the sole basis for treatment or other patient management decisions. A negative result may occur with  improper specimen collection/handling, submission of specimen other than nasopharyngeal swab, presence of viral mutation(s) within the areas targeted by this assay, and inadequate number of viral copies (<131 copies/mL). A negative result must be combined with clinical observations, patient history, and epidemiological information. The expected result is Negative.  Fact Sheet for Patients:  PinkCheek.be  Fact Sheet for Healthcare Providers:  GravelBags.it  This test is no t yet approved or cleared by the [place] FDA and  has been authorized for detection and/or diagnosis of SARS-CoV-2 by FDA under an Emergency Use Authorization (EUA). This EUA will remain  in effect (meaning this test can be used) for the duration of the COVID-19 declaration under Section 564(b)(1) of the Act, 21 U.S.C. section 360bbb-3(b)(1), unless the authorization is terminated or revoked sooner.     Influenza A by PCR NEGATIVE NEGATIVE Final   Influenza B by PCR NEGATIVE NEGATIVE Final    Comment: (NOTE) The Xpert Xpress SARS-CoV-2/FLU/RSV assay is intended as an aid in  the diagnosis of influenza from Nasopharyngeal swab specimens and  should not be used as a sole basis for treatment. Nasal washings and  aspirates are unacceptable for Xpert Xpress SARS-CoV-2/FLU/RSV  testing.  Fact Sheet for Patients: PinkCheek.be  Fact Sheet for Healthcare Providers: GravelBags.it  This test is not yet approved or cleared by the [place] FDA and  has been  authorized for detection and/or diagnosis of SARS-CoV-2 by  FDA under an Emergency Use Authorization (EUA). This EUA will remain  in effect (meaning this test can be used) for the duration of the  Covid-19 declaration under Section 564(b)(1) of the Act, 21  U.S.C. section 360bbb-3(b)(1), unless the authorization is  terminated or revoked. Performed at [facility], [address] [place], [place] [address]   Blood Culture (routine x 2)     Status: None (Preliminary result)   Collection Time: 12/30/19  5:33 AM   Specimen: BLOOD  Result Value Ref Range Status   Specimen Description BLOOD LEFT ANTECUBITAL  Final   Special Requests   Final    BOTTLES DRAWN AEROBIC AND ANAEROBIC Blood Culture results may not be optimal due to an excessive volume of  blood received in culture bottles   Culture   Final    NO GROWTH < 24 HOURS Performed at [facility], [address] [place], [place] [address]    Report Status PENDING  Incomplete         Radiology Studies: DG Chest 2 View  Result Date: 12/29/2019 CLINICAL DATA:  Weakness.  Productive cough and fever. EXAM: CHEST - 2 VIEW COMPARISON:  11/06/2019 FINDINGS: Heart size and vascularity normal.  Negative for heart failure Elevated left hemidiaphragm with distended bowel below the diaphragm similar to the prior study. Mild left lower lobe airspace disease unchanged from the prior study. Probable atelectasis. No definite pneumonia or effusion on the lateral view. IMPRESSION: Elevated left hemidiaphragm with mild left lower lobe atelectasis unchanged from the prior study. Electronically Signed   By: Franchot Gallo M.D.   On: 12/29/2019 20:10   CT Chest Wo Contrast  Result Date: 12/30/2019 CLINICAL DATA:  Pneumonia, effusion, or abscess suspected. EXAM: CT CHEST WITHOUT CONTRAST TECHNIQUE: Multidetector CT imaging of the chest was performed following the standard protocol without IV contrast. COMPARISON:  Chest radiographs  04/27/2019 and 06/03/2019 FINDINGS: Cardiovascular: No significant vascular findings. Normal heart size. No pericardial effusion. Coronary artery and aortic atherosclerosis. Mediastinum/Nodes: No enlarged mediastinal or axillary lymph nodes. Thyroid gland and trachea demonstrate no significant findings. Small hiatal hernia. Lungs/Pleura: No consolidation. No pleural effusion. No pneumothorax. Mild scarring/fibrotic change in the right upper lobe and linear atelectasis.There is a 5 mm pulmonary nodule in the right upper lobe (series 3, image 46) there is an approximately 6 mm pulmonary nodule in the left upper lobe (series 3, image 30) in the right upper lobe. Irregular 4 mm nodule in the left upper lobe (image 21). Upper Abdomen: Partially imaged left renal cyst. Musculoskeletal: Remote compression fracture in the lower thoracic spine. Multilevel degenerative change of the spine. Dextrocurvature. IMPRESSION: 1. Linear atelectasis in the right lower lobe. Otherwise, no acute cardiopulmonary abnormality. 2. Multiple pulmonary nodules, measuring up to 6 mm. Non-contrast chest CT at 3-6 months is recommended. If the nodules are stable at time of repeat CT, then future CT at 18-24 months (from today's scan) is considered optional for low-risk patients, but is recommended for high-risk patients. This recommendation follows the consensus statement: Guidelines for Management of Incidental Pulmonary Nodules Detected on CT Images: From the Fleischner Society 2017; Radiology 2017; 284:228-243. Electronically Signed   By: Margaretha Sheffield MD   On: 12/30/2019 07:33        Scheduled Meds: . aspirin EC  81 mg Oral Daily  . atorvastatin  40 mg Oral Daily  . buPROPion  150 mg Oral Daily  . carbidopa-levodopa  1 tablet Oral QID  . enoxaparin (LOVENOX) injection  40 mg Subcutaneous Q24H  . folic acid  3 mg Oral Daily  . insulin aspart  0-5 Units Subcutaneous QHS  . insulin aspart  0-9 Units Subcutaneous TID WC  .  memantine  5 mg Oral BID  . [START ON 01/03/2020] Methotrexate  0.8 mL Subcutaneous Q Tue  . PARoxetine  40 mg Oral Daily  . QUEtiapine  175 mg Oral QHS  . rivastigmine  1.5 mg Oral BID  . tamsulosin  0.4 mg Oral Daily   Continuous Infusions: . cefTRIAXone (ROCEPHIN)  IV Stopped (12/31/19 2979)     LOS: 1 day    Time spent: 25 minutes    Sidney Ace, MD [facility] Pager [phone]  If 7PM-7AM, please contact night-coverage  12/31/2019, 11:41 AM

## 2020-01-01 LAB — URINALYSIS, COMPLETE (UACMP) WITH MICROSCOPIC
Bilirubin Urine: NEGATIVE
Glucose, UA: NEGATIVE mg/dL
Hgb urine dipstick: NEGATIVE
Ketones, ur: 5 mg/dL — AB
Nitrite: NEGATIVE
Protein, ur: NEGATIVE mg/dL
Specific Gravity, Urine: 1.016 (ref 1.005–1.030)
Squamous Epithelial / HPF: NONE SEEN (ref 0–5)
pH: 6 (ref 5.0–8.0)

## 2020-01-01 LAB — GLUCOSE, CAPILLARY
Glucose-Capillary: 102 mg/dL — ABNORMAL HIGH (ref 70–99)
Glucose-Capillary: 120 mg/dL — ABNORMAL HIGH (ref 70–99)
Glucose-Capillary: 93 mg/dL (ref 70–99)
Glucose-Capillary: 100 mg/dL — ABNORMAL HIGH (ref 70–99)

## 2020-01-01 NOTE — Plan of Care (Signed)
Continuing with plan of care. 

## 2020-01-01 NOTE — Progress Notes (Signed)
Physical Therapy Evaluation Patient Details Name: Seth Mcpherson MRN: [id number] DOB: 1946-03-01 Today's Date: 01/01/2020   History of Present Illness  Seth Mcpherson is a 74 y.o. male with medical history significant of hypertension, hyperlipidemia, diabetes mellitus, depression, prostate cancer, Parkinson's disease, dementia, chronic diarrhea, recent admission due to sepsis 2/2 UTI and pneumonia, who presents with dysuria and burning on urination and cough.Seth Mcpherson is a 74 y.o. male with medical history significant of hypertension, hyperlipidemia, diabetes mellitus, depression, prostate cancer, Parkinson's disease, dementia, chronic diarrhea, recent admission due to sepsis 2/2 UTI and pneumonia, who presents with dysuria and burning on urination and cough. Pt admitted for severe sepsis.  Clinical Impression  Patient agrees to PT evaluation. Pt reports no pain. Pt lives with wife currently has HHPT and home health aide.. Pt has been mostly bed bound since 11/06/19. Pt has 2/5 strength RLE hip and knee, and 3/5 LLE hip and knee. He is max assist  for bed mobility including rolling and supine <> sit,  and needs mod assist to sit EOB. Pt will continue to benefit from skilled PT to improve mobility and strength.    Follow Up Recommendations SNF    Equipment Recommendations  Rolling walker with 5" wheels    Recommendations for Other Services       Precautions / Restrictions Precautions Precautions: None Restrictions Weight Bearing Restrictions: No      Mobility  Bed Mobility Overal bed mobility: Needs Assistance Bed Mobility: Supine to Sit;Sit to Supine Rolling: Max assist   Supine to sit: Max assist Sit to supine: Max assist   General bed mobility comments: continous cues to stay on task  Transfers Overall transfer level:  (unable to stand)                  Ambulation/Gait                Stairs            Wheelchair Mobility    Modified Rankin (Stroke  Patients Only)       Balance Overall balance assessment: Needs assistance Sitting-balance support: Bilateral upper extremity supported;Feet supported Sitting balance-Leahy Scale: Fair                                       Pertinent Vitals/Pain Pain Assessment: No/denies pain    Home Living Family/patient expects to be discharged to:: Skilled nursing facility Living Arrangements: Spouse/significant other                    Prior Function Level of Independence: Needs assistance   Gait / Transfers Assistance Needed: mostly bed bound  ADL's / Homemaking Assistance Needed: max assist ADLS        Hand Dominance        Extremity/Trunk Assessment   Upper Extremity Assessment Upper Extremity Assessment: Defer to OT evaluation    Lower Extremity Assessment Lower Extremity Assessment: RLE deficits/detail;LLE deficits/detail RLE Deficits / Details: 2/5 hip and knee strenght LLE Deficits / Details: 3/5 hip and knee strength       Communication   Communication: No difficulties  Cognition Arousal/Alertness: Awake/alert Behavior During Therapy: WFL for tasks assessed/performed Overall Cognitive Status: History of cognitive impairments - at baseline  General Comments: rambling, needs continous cues to stay on task      General Comments      Exercises     Assessment/Plan    PT Assessment Patient needs continued PT services  PT Problem List Decreased strength;Decreased range of motion;Decreased activity tolerance;Decreased balance;Decreased mobility;Decreased coordination;Decreased cognition;Decreased knowledge of use of DME;Decreased safety awareness       PT Treatment Interventions Gait training;Functional mobility training;Therapeutic activities;Therapeutic exercise;Balance training;Neuromuscular re-education;Patient/family education    PT Goals (Current goals can be found in the Care Plan  section)  Acute Rehab PT Goals Patient Stated Goal: to get stronger PT Goal Formulation: With patient/family Time For Goal Achievement: 01/08/20 Potential to Achieve Goals: Fair    Frequency Min 2X/week   Barriers to discharge Decreased caregiver support      Co-evaluation               AM-PAC PT "6 Clicks" Mobility  Outcome Measure Help needed turning from your back to your side while in a flat bed without using bedrails?: Total Help needed moving from lying on your back to sitting on the side of a flat bed without using bedrails?: Total Help needed moving to and from a bed to a chair (including a wheelchair)?: Total Help needed standing up from a chair using your arms (e.g., wheelchair or bedside chair)?: Total Help needed to walk in hospital room?: Total Help needed climbing 3-5 steps with a railing? : Total 6 Click Score: 6    End of Session Equipment Utilized During Treatment: Gait belt Activity Tolerance: Patient limited by fatigue Patient left: in bed;with bed alarm set Nurse Communication: Mobility status PT Visit Diagnosis: Unsteadiness on feet (R26.81);Muscle weakness (generalized) (M62.81);Difficulty in walking, not elsewhere classified (R26.2);Other symptoms and signs involving the nervous system (R29.898)    Time: 7943-2761 PT Time Calculation (min) (ACUTE ONLY): 25 min   Charges:   PT Evaluation $PT Eval Low Complexity: 1 Low PT Treatments $Therapeutic Activity: 8-22 mins          Alanson Puls, PT DPT 01/01/2020, 1:04 PM

## 2020-01-01 NOTE — Progress Notes (Signed)
PROGRESS NOTE    Seth Mcpherson  OVZ:[id number] DOB: 1946/03/27 DOA: 12/30/2019 PCP: Marygrace Drought, MD    Brief Narrative:  HPI: Seth Mcpherson is a 74 y.o. male with medical history significant of hypertension, hyperlipidemia, diabetes mellitus, depression, prostate cancer, Parkinson's disease, dementia, chronic diarrhea, recent admission due to sepsis 2/2 UTI and pneumonia, who presents with dysuria and burning on urination and cough.  Patient was recently hospitalized from 8/1-8/3 due to sepsis secondary to UTI and possible pneumonia. Urine culture was positive for Proteus mirabilis. Patient was treated with IV Rocephin and azithromycin, and discharged on cefdinir.  Per patient's wife, patient has dysuria, burning on urination, increased urinary frequency in the past several days.  Patient has generalized weakness.  He also has dry cough, mild shortness breath, no chest pain.  No fever or chills.  Patient has history of IBS, with chronic intermittent mild diarrhea, which has not changed.  Today patient does not have nausea, vomiting, diarrhea or abdominal pain.  No facial droop or slurred speech. Wife states that pt is taking methotrexate for pemphigoid.   Assessment & Plan:   Principal Problem:   UTI (urinary tract infection) Active Problems:   Type 2 diabetes mellitus without complication (HCC)   Essential hypertension   Parkinson disease (HCC)   AKI (acute kidney injury) ([place])   Severe sepsis with septic shock (HCC)   HLD (hyperlipidemia)   Depression   Dementia (HCC)   Prostate cancer (HCC)   Cough   Pulmonary nodules  Severe sepsis due to UTI Shock physiology resolved  Patient has leukocytosis with WBC 12.3, tachycardia with heart rate 133, elevated lactic acid 7.9 and AKI, meets criteria for severe sepsis.  Blood pressure responded to fluid resuscitation.  Shock physiology resolved  Urinalysis positive for UTI.   Patient had positive culture for Proteus mirabilis  recently. Clinically appears improved over interval Urine culture demonstrating contaminant Plan: Continue Rocephin Repeat UA, mandatory culture Antiemetics as needed   Type 2 diabetes mellitus without complication (HCC)  Recent A1c 5.8, well controlled.  Patient taking Metformin at home -SSI  Essential hypertension: Blood pressures are soft -Hold blood pressure medications  Parkinson disease ([place]) -Sinemet  AKI (acute kidney injury) ([place]), resolved  Likely due to UTI -f/u by BMP -IV fluid as above  HLD (hyperlipidemia): -Lipitor  Depression -Continue home medications  Dementia ([place]) -Rivastigmine and Namenda  Prostate cancer [facility]): Per patient's wife, patient did not have surgery, currently is not treated for prostate cancer. -Continue Flomax -Need to follow-up with urology  Cough: Etiology is not clear.  Chest x-ray and CT of chest did not show pneumonia.  Patient has hx of Parkinson's disease, will need SLP evaluation to r/o aspiration.  SLP evaluation complete.  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   Resolving sepsis physiology.  Unfortunately first urinalysis was contaminant no pathogen identified.  Repeat UA ordered.  Plan to continue Rocephin for 1 additional day and transitioning to oral antibiotics  tomorrow.  Disposition plan skilled nursing facility.  Case management on board.  Consultants:   None  Procedures:   None  Antimicrobials:   Ceftriaxone   Subjective: Seen and examined.  No pain complaints this morning.  Energy level improving.  Able to speak in complete sentences.  Lucid thought pattern.  Objective: Vitals:   12/31/19 0752 12/31/19 1141 12/31/19 2014 01/01/20 0512  BP: 103/62 110/70 121/80 (!) 110/57  Pulse: 64 70 80 (!) 57  Resp: 16 20 20 20   Temp: 98 F (36.7 C) 98.2 F (36.8 C) 98.2 F (36.8 C) (!) 97.5 F (36.4 C)  TempSrc: Oral Oral    SpO2: 95% 96% 97% 97%  Weight:      Height:        Intake/Output Summary (Last 24 hours) at 01/01/2020 1038 Last data filed at 01/01/2020 8242 Gross per 24 hour  Intake 1420.59 ml  Output 250 ml  Net 1170.59 ml   Filed Weights   12/29/19 1931  Weight: 63.5 kg    Examination:  General: No apparent distress, patient appears frail HEENT: Normocephalic, atraumatic Neck, supple, trachea midline, no tenderness Heart: Regular rate and rhythm, S1/S2 normal, no murmurs Lungs: Mild bibasilar crackles.  Normal work of breathing.  Room air Abdomen: Soft, tender to palpation epigastrium, nondistended, positive bowel sounds Extremities: Normal, atraumatic, no clubbing or cyanosis, normal muscle tone Skin: No rashes or lesions, normal color Neurologic: Cranial nerves grossly intact.  Resting tremor noted.  Alert, oriented x3 Psychiatric: Normal affect     Data Reviewed: I have personally reviewed following labs and imaging studies  CBC: Recent Labs  Lab 12/29/19 1931 12/30/19 0542 12/31/19 0613  WBC 7.6 12.3* 7.1  NEUTROABS  --  10.9*  --   HGB 13.6 14.7 11.1*  HCT 42.0 44.1 32.8*  MCV 89.9 87.7 86.5  PLT 232 257 353   Basic Metabolic Panel: Recent Labs  Lab 12/29/19 1931 12/30/19 0542 12/31/19 0613  NA 138 139 137  K 4.3 4.7 3.7  CL 97* 100 100  CO2 26 18* 29  GLUCOSE 111* 157* 94  BUN 23  32* 19  CREATININE 0.88 1.42* 0.85  CALCIUM 9.2 9.9 8.6*   GFR: Estimated Creatinine Clearance: 67.4 mL/min (by C-G formula based on SCr of 0.85 mg/dL). Liver Function Tests: Recent Labs  Lab 12/30/19 0542  AST 27  ALT 15  ALKPHOS 110  BILITOT 1.3*  PROT 8.2*  ALBUMIN 4.8   No results for input(s): LIPASE, AMYLASE in the last 168 hours. No results for input(s): AMMONIA in the last 168 hours. Coagulation Profile: Recent Labs  Lab 12/30/19 0542  INR 1.0   Cardiac Enzymes: No results for input(s): CKTOTAL, CKMB, CKMBINDEX, TROPONINI in the last 168 hours. BNP (last 3 results) No results for input(s): PROBNP in the last 8760 hours. HbA1C: No results for input(s): HGBA1C in the last 72 hours. CBG: Recent Labs  Lab 12/31/19 0744 12/31/19 1139 12/31/19 1628 12/31/19 2101 01/01/20 0751  GLUCAP 84 146* 135* 111* 102*   Lipid Profile: No results for input(s): CHOL, HDL, LDLCALC, TRIG, CHOLHDL, LDLDIRECT in the last 72 hours. Thyroid Function Tests: No results for input(s): TSH, T4TOTAL, FREET4, T3FREE, THYROIDAB in the last 72 hours. Anemia Panel: No results  for input(s): VITAMINB12, FOLATE, FERRITIN, TIBC, IRON, RETICCTPCT in the last 72 hours. Sepsis Labs: Recent Labs  Lab 12/30/19 0533 12/30/19 0849 12/30/19 1039  PROCALCITON  --  <0.10  --   LATICACIDVEN 7.9* 1.5 4.0*    Recent Results (from the past 240 hour(s))  Blood Culture (routine x 2)     Status: None (Preliminary result)   Collection Time: 12/30/19  5:31 AM   Specimen: BLOOD  Result Value Ref Range Status   Specimen Description BLOOD BLOOD LEFT FOREARM  Final   Special Requests   Final    BOTTLES DRAWN AEROBIC AND ANAEROBIC Blood Culture adequate volume   Culture   Final    NO GROWTH 2 DAYS Performed at [facility], [address] [place], [place] [address]    Report Status PENDING  Incomplete  Respiratory Panel by RT PCR (Flu A&B, Covid) - Nasopharyngeal Swab     Status: None    Collection Time: 12/30/19  5:31 AM   Specimen: Nasopharyngeal Swab  Result Value Ref Range Status   SARS Coronavirus 2 by RT PCR NEGATIVE NEGATIVE Final    Comment: (NOTE) SARS-CoV-2 target nucleic acids are NOT DETECTED.  The SARS-CoV-2 RNA is generally detectable in upper respiratoy specimens during the acute phase of infection. The lowest concentration of SARS-CoV-2 viral copies this assay can detect is 131 copies/mL. A negative result does not preclude SARS-Cov-2 infection and should not be used as the sole basis for treatment or other patient management decisions. A negative result may occur with  improper specimen collection/handling, submission of specimen other than nasopharyngeal swab, presence of viral mutation(s) within the areas targeted by this assay, and inadequate number of viral copies (<131 copies/mL). A negative result must be combined with clinical observations, patient history, and epidemiological information. The expected result is Negative.  Fact Sheet for Patients:  PinkCheek.be  Fact Sheet for Healthcare Providers:  GravelBags.it  This test is no t yet approved or cleared by the [place] FDA and  has been authorized for detection and/or diagnosis of SARS-CoV-2 by FDA under an Emergency Use Authorization (EUA). This EUA will remain  in effect (meaning this test can be used) for the duration of the COVID-19 declaration under Section 564(b)(1) of the Act, 21 U.S.C. section 360bbb-3(b)(1), unless the authorization is terminated or revoked sooner.     Influenza A by PCR NEGATIVE NEGATIVE Final   Influenza B by PCR NEGATIVE NEGATIVE Final    Comment: (NOTE) The Xpert Xpress SARS-CoV-2/FLU/RSV assay is intended as an aid in  the diagnosis of influenza from Nasopharyngeal swab specimens and  should not be used as a sole basis for treatment. Nasal washings and  aspirates are unacceptable for Xpert  Xpress SARS-CoV-2/FLU/RSV  testing.  Fact Sheet for Patients: PinkCheek.be  Fact Sheet for Healthcare Providers: GravelBags.it  This test is not yet approved or cleared by the [place] FDA and  has been authorized for detection and/or diagnosis of SARS-CoV-2 by  FDA under an Emergency Use Authorization (EUA). This EUA will remain  in effect (meaning this test can be used) for the duration of the  Covid-19 declaration under Section 564(b)(1) of the Act, 21  U.S.C. section 360bbb-3(b)(1), unless the authorization is  terminated or revoked. Performed at [facility], [address] [place], [place] [address]   Blood Culture (routine x 2)     Status: None (Preliminary result)   Collection Time: 12/30/19  5:33 AM   Specimen: BLOOD  Result  Value Ref Range Status   Specimen Description BLOOD LEFT ANTECUBITAL  Final   Special Requests   Final    BOTTLES DRAWN AEROBIC AND ANAEROBIC Blood Culture results may not be optimal due to an excessive volume of blood received in culture bottles   Culture   Final    NO GROWTH 2 DAYS Performed at [facility], [address] [place], [place] [address]    Report Status PENDING  Incomplete  Urine culture     Status: Abnormal   Collection Time: 12/30/19  5:51 AM   Specimen: Urine, Random  Result Value Ref Range Status   Specimen Description   Final    URINE, RANDOM Performed at [facility], [address], [place], [place] [address]    Special Requests   Final    NONE Performed at [facility], [address] [place], [place] [address]    Culture MULTIPLE SPECIES PRESENT, SUGGEST RECOLLECTION (A)  Final   Report Status 12/31/2019 FINAL  Final         Radiology Studies: No results found.      Scheduled Meds: . aspirin EC  81 mg Oral Daily  . atorvastatin  40 mg Oral Daily  . buPROPion  150 mg Oral Daily  . carbidopa-levodopa   1 tablet Oral QID  . enoxaparin (LOVENOX) injection  40 mg Subcutaneous Q24H  . folic acid  3 mg Oral Daily  . insulin aspart  0-5 Units Subcutaneous QHS  . insulin aspart  0-9 Units Subcutaneous TID WC  . memantine  5 mg Oral BID  . [START ON 01/03/2020] Methotrexate  0.8 mL Subcutaneous Q Tue  . PARoxetine  40 mg Oral Daily  . QUEtiapine  175 mg Oral QHS  . rivastigmine  1.5 mg Oral BID  . tamsulosin  0.4 mg Oral Daily   Continuous Infusions: . cefTRIAXone (ROCEPHIN)  IV Stopped (01/01/20 5883)     LOS: 2 days    Time spent: 25 minutes    Sidney Ace, MD [facility] Pager [phone]  If 7PM-7AM, please contact night-coverage 01/01/2020, 10:38 AM

## 2020-01-01 NOTE — NC FL2 (Signed)
[place] LEVEL OF CARE SCREENING TOOL     IDENTIFICATION  Patient Name: Seth Mcpherson Birthdate: [birth year] Sex: male Admission Date (Current Location): 12/30/2019  [place] and [place] Number:  Engineering geologist and Address:  [facility], [address] [place], [place] [address]      Provider Number: [id number]  Attending Physician Name and Address:  Sidney Ace, MD  Relative Name and Phone Number:  Patient's wife, Seth Mcpherson ([phone])    Current Level of Care: Hospital Recommended Level of Care: [place] Prior Approval Number:    Date Approved/Denied:   PASRR Number: Pending PSARR review  Discharge Plan: SNF    Current Diagnoses: Patient Active Problem List   Diagnosis Date Noted  . HLD (hyperlipidemia) 12/30/2019  . Depression 12/30/2019  . Dementia ([place])   . Prostate cancer ([place])   . Cough   . UTI (urinary tract infection)   . Pulmonary nodules   . Severe sepsis with septic shock ([place]) 11/06/2019  . Chronic diarrhea 08/05/2019  . Closed right acetabular fracture ([place]) 08/05/2019  . Type 2 diabetes mellitus without complication ([place]) 62/22/9798  . Essential hypertension 08/05/2019  . Parkinson disease ([place]) 08/05/2019  . Cognitive deficit due to Parkinson's disease ([place]) 08/05/2019  . Syncope 08/05/2019  . Hypotension 08/05/2019  . AKI (acute kidney injury) ([place]) 08/05/2019  . Acetabular fracture ([place]) 08/05/2019    Orientation RESPIRATION BLADDER Height & Weight     Self, Time, Situation, Place  Normal Continent Weight: 140 lb (63.5 kg) Height:  6' (182.9 cm)  BEHAVIORAL SYMPTOMS/MOOD NEUROLOGICAL BOWEL NUTRITION STATUS      Continent Diet (Heart)  AMBULATORY STATUS COMMUNICATION OF NEEDS Skin   Extensive Assist Verbally Normal                       Personal Care Assistance Level of Assistance  Bathing, Dressing, Feeding Bathing Assistance: Maximum assistance Feeding  assistance: Maximum assistance Dressing Assistance: Maximum assistance     Functional Limitations Info             SPECIAL CARE FACTORS FREQUENCY                       Contractures Contractures Info: Not present    Additional Factors Info  Code Status Code Status Info: FULL             Current Medications (01/01/2020):  This is the current hospital active medication list Current Facility-Administered Medications  Medication Dose Route Frequency Provider Last Rate Last Admin  . acetaminophen (TYLENOL) tablet 650 mg  650 mg Oral Q6H PRN Ivor Costa, MD      . albuterol (VENTOLIN HFA) 108 (90 Base) MCG/ACT inhaler 2 puff  2 puff Inhalation Q4H PRN Ivor Costa, MD      . aspirin EC tablet 81 mg  81 mg Oral Daily Ivor Costa, MD   81 mg at 01/01/20 0835  . atorvastatin (LIPITOR) tablet 40 mg  40 mg Oral Daily Ivor Costa, MD   40 mg at 01/01/20 0836  . buPROPion (WELLBUTRIN XL) 24 hr tablet 150 mg  150 mg Oral Daily Ivor Costa, MD   150 mg at 01/01/20 0836  . carbidopa-levodopa (SINEMET IR) 25-100 MG per tablet immediate release 1 tablet  1 tablet Oral QID Ivor Costa, MD   1 tablet at 01/01/20 1248  . cefTRIAXone (ROCEPHIN) 1 g in sodium chloride 0.9 % 100 mL  IVPB  1 g Intravenous Q24H Ivor Costa, MD   Stopped at 01/01/20 [phone]  . dextromethorphan-guaiFENesin (MUCINEX DM) 30-600 MG per 12 hr tablet 1 tablet  1 tablet Oral BID PRN Ivor Costa, MD      . enoxaparin (LOVENOX) injection 40 mg  40 mg Subcutaneous Q24H Ivor Costa, MD   40 mg at 01/01/20 0835  . folic acid (FOLVITE) tablet 3 mg  3 mg Oral Daily Ivor Costa, MD   3 mg at 01/01/20 0835  . insulin aspart (novoLOG) injection 0-5 Units  0-5 Units Subcutaneous QHS Ivor Costa, MD      . insulin aspart (novoLOG) injection 0-9 Units  0-9 Units Subcutaneous TID WC Ivor Costa, MD   1 Units at 12/31/19 1720  . memantine (NAMENDA) tablet 5 mg  5 mg Oral BID Ivor Costa, MD   5 mg at 01/01/20 0836  . [START ON 01/03/2020] Methotrexate  SOSY 0.8 mL  0.8 mL Subcutaneous Q Judge Stall, Soledad Gerlach, MD      . ondansetron [facility]) injection 4 mg  4 mg Intravenous Q8H PRN Ivor Costa, MD      . PARoxetine (PAXIL) tablet 40 mg  40 mg Oral Daily Ivor Costa, MD   40 mg at 01/01/20 0836  . QUEtiapine (SEROQUEL) tablet 175 mg  175 mg Oral QHS Ivor Costa, MD   175 mg at 12/31/19 2125  . rivastigmine (EXELON) capsule 1.5 mg  1.5 mg Oral BID Ivor Costa, MD   1.5 mg at 01/01/20 0836  . tamsulosin (FLOMAX) capsule 0.4 mg  0.4 mg Oral Daily Ivor Costa, MD   0.4 mg at 01/01/20 6812     Discharge Medications: Please see discharge summary for a list of discharge medications.  Relevant Imaging Results:  Relevant Lab Results:   Additional Information SSN: [id number]  Trecia Rogers, [place]

## 2020-01-01 NOTE — TOC Initial Note (Signed)
Transition of Care [facility]) - Initial/Assessment Note    Patient Details  Name: Seth Mcpherson MRN: [id number] Date of Birth: 1946-03-20  Transition of Care [facility]) CM/SW Contact:    Trecia Rogers, [place] Phone Number: 01/01/2020, 2:54 PM  Clinical Narrative:                  Patient arrived from home due to UTI. Per patient's wife, [name], he went to the hospital on 11/06/2019 for sepsis and returned home after 48 hours with home health through [facility]. Patient's wife states that he is too much for her and that she is unable to care for him due to his needs. Patient's wife states that she worked with the home health social worker to try to get him into [facility] within the 30 day window. The FL2 was not completed right, so the patient's wife states that [facility] fixed it and tried to get a Insurance risk surveyor.   NCM[facility] shows that they were unable to process the FL2 from the [place]. CSW attempted to call [facility] 3x times and left a voicemail for the screener of the [place]. CSW updated the FL2 and awaiting signature.  Patient's wife states that she does not think that he will live to come back home due to the decline in his health and she wants him to go to SNF. Patient's wife states that she wants him to go with [facility] since they have a bed for him but okay with other options except: [facility] and [employer]   Pre hospital visit: Patient had at home palliative care through [facility] with PT and home aid through [facility]. Patient's wife stated that she has also private paid for some CNA help. Patient has the follow DMEs at home: hospital bed, wheelchair, walker, and shoe lift.  Patient's PCP: Marygrace Drought Patient's Pharmacy: [facility] in [place] (does not have any issues obtaining his medications).  Patient does have a diagnoses of Dementia and Depression and on several psych medications. Patient's wife stated that he is on  Wellbutrin for his depression, seroquel for his delirium symptoms, and paxil for his dementia.    Expected Discharge Plan: Skilled Nursing Facility Barriers to Discharge: [place] (PASRR), Continued Medical Work up   Patient Goals and CMS Choice Patient states their goals for this hospitalization and ongoing recovery are:: "get strong enough to come back home" CMS Medicare.gov Compare Post Acute Care list provided to:: Patient Represenative (must comment) (Patient's wife) Choice offered to / list presented to : Spouse, Patient  Expected Discharge Plan and Services Expected Discharge Plan: [place] Choice: [place] Living arrangements for the past 2 months: Single Family Home                                      Prior Living Arrangements/Services Living arrangements for the past 2 months: Single Family Home Lives with:: Self, Spouse Patient language and need for interpreter reviewed:: Yes Do you feel safe going back to the place where you live?: Yes      Need for Family Participation in Patient Care: Yes (Comment) (Patient's wife) Care giver support system in place?: Yes (comment) (Patient's wife) Current home services: DME, Home PT, Home RT, Other (comment) (DME (hospital bed, wheelchair, walker, and shoe life); home pallative care)  Activities of Daily Living Home Assistive Devices/Equipment: Wheelchair ADL Screening (condition at time of admission) Patient's cognitive ability adequate to safely complete daily activities?: No Is the patient deaf or have difficulty hearing?: No Does the patient have difficulty seeing, even when wearing glasses/contacts?: No Does the patient have difficulty concentrating, remembering, or making decisions?: Yes Patient able to express need for assistance with ADLs?: Yes Does the patient have difficulty dressing or bathing?: Yes Independently performs ADLs?:  No Communication: Dependent Is this a change from baseline?: Pre-admission baseline Dressing (OT): Dependent Is this a change from baseline?: Pre-admission baseline Grooming: Dependent Is this a change from baseline?: Pre-admission baseline Feeding: Needs assistance Is this a change from baseline?: Pre-admission baseline Bathing: Dependent Is this a change from baseline?: Pre-admission baseline Toileting: Dependent Is this a change from baseline?: Pre-admission baseline In/Out Bed: Dependent Is this a change from baseline?: Pre-admission baseline Walks in Home: Needs assistance Is this a change from baseline?: Pre-admission baseline Does the patient have difficulty walking or climbing stairs?: Yes Weakness of Legs: Both Weakness of Arms/Hands: None  Permission Sought/Granted                  Emotional Assessment Appearance:: Other (Comment Required (unable to access over the phone) Attitude/Demeanor/Rapport: Unable to Assess Affect (typically observed): Unable to Assess Orientation: : Oriented to Self, Oriented to Situation, Oriented to Place, Oriented to  Time Alcohol / Substance Use: Not Applicable Psych Involvement: No (comment)  Admission diagnosis:  UTI (urinary tract infection) [N39.0] Sepsis, due to unspecified organism, unspecified whether acute organ dysfunction present [facility]) [A41.9] Patient Active Problem List   Diagnosis Date Noted  . HLD (hyperlipidemia) 12/30/2019  . Depression 12/30/2019  . Dementia ([place])   . Prostate cancer ([place])   . Cough   . UTI (urinary tract infection)   . Pulmonary nodules   . Severe sepsis with septic shock ([place]) 11/06/2019  . Chronic diarrhea 08/05/2019  . Closed right acetabular fracture ([place]) 08/05/2019  . Type 2 diabetes mellitus without complication ([place]) 10/93/2355  . Essential hypertension 08/05/2019  . Parkinson disease ([place]) 08/05/2019  . 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 Syncope  08/05/2019  . Hypotension 08/05/2019  . AKI (acute kidney injury) ([place]) 08/05/2019  . Acetabular fracture ([place]) 08/05/2019   PCP:  Marygrace Drought, MD Pharmacy:   [facility] [address] [place] [address] [place] [address] Phone: [phone] Fax: [phone]     Social Determinants of Health (SDOH) Interventions    Readmission Risk Interventions No flowsheet data found.

## 2020-01-02 DIAGNOSIS — R05 Cough: Secondary | ICD-10-CM

## 2020-01-02 DIAGNOSIS — G20 Parkinson's disease: Secondary | ICD-10-CM

## 2020-01-02 DIAGNOSIS — N179 Acute kidney failure, unspecified: Secondary | ICD-10-CM

## 2020-01-02 LAB — CBC WITH DIFFERENTIAL/PLATELET
Abs Immature Granulocytes: 0.02 10*3/uL (ref 0.00–0.07)
Basophils Absolute: 0.1 10*3/uL (ref 0.0–0.1)
Basophils Relative: 1 %
Eosinophils Absolute: 0.2 10*3/uL (ref 0.0–0.5)
Eosinophils Relative: 4 %
HCT: 32.8 % — ABNORMAL LOW (ref 39.0–52.0)
Hemoglobin: 11.2 g/dL — ABNORMAL LOW (ref 13.0–17.0)
Immature Granulocytes: 0 %
Lymphocytes Relative: 20 %
Lymphs Abs: 1.1 10*3/uL (ref 0.7–4.0)
MCH: 29.6 pg (ref 26.0–34.0)
MCHC: 34.1 g/dL (ref 30.0–36.0)
MCV: 86.5 fL (ref 80.0–100.0)
Monocytes Absolute: 1 10*3/uL (ref 0.1–1.0)
Monocytes Relative: 17 %
Neutro Abs: 3.3 10*3/uL (ref 1.7–7.7)
Neutrophils Relative %: 58 %
Platelets: 194 10*3/uL (ref 150–400)
RBC: 3.79 MIL/uL — ABNORMAL LOW (ref 4.22–5.81)
RDW: 16 % — ABNORMAL HIGH (ref 11.5–15.5)
WBC: 5.7 10*3/uL (ref 4.0–10.5)
nRBC: 0 % (ref 0.0–0.2)

## 2020-01-02 LAB — BASIC METABOLIC PANEL
Anion gap: 9 (ref 5–15)
BUN: 17 mg/dL (ref 8–23)
CO2: 29 mmol/L (ref 22–32)
Calcium: 8.7 mg/dL — ABNORMAL LOW (ref 8.9–10.3)
Chloride: 102 mmol/L (ref 98–111)
Creatinine, Ser: 0.77 mg/dL (ref 0.61–1.24)
GFR calc Af Amer: >60 mL/min (ref >60)
GFR calc non Af Amer: >60 mL/min (ref >60)
Glucose, Bld: 94 mg/dL (ref 70–99)
Potassium: 3.7 mmol/L (ref 3.5–5.1)
Sodium: 140 mmol/L (ref 135–145)

## 2020-01-02 LAB — GLUCOSE, CAPILLARY
Glucose-Capillary: 87 mg/dL (ref 70–99)
Glucose-Capillary: 90 mg/dL (ref 70–99)
Glucose-Capillary: 142 mg/dL — ABNORMAL HIGH (ref 70–99)
Glucose-Capillary: 109 mg/dL — ABNORMAL HIGH (ref 70–99)

## 2020-01-02 MED ORDER — CEFDINIR 300 MG PO CAPS
300.0000 mg | ORAL_CAPSULE | Freq: Two times a day (BID) | ORAL | Status: DC
  Administered 2020-01-02 – 2020-01-03 (×3): 300 mg via ORAL
  Filled 2020-01-02 (×4): qty 1

## 2020-01-02 NOTE — NC FL2 (Signed)
[place] LEVEL OF CARE SCREENING TOOL     IDENTIFICATION  Patient Name: Seth Mcpherson Birthdate: [birth year] Sex: male Admission Date (Current Location): 12/30/2019  [place] and [place] Number:  Engineering geologist and Address:  [facility], [address] [place], [place] [address]      Provider Number: [id number]  Attending Physician Name and Address:  Sidney Ace, MD  Relative Name and Phone Number:  Patient's wife, Colten Desroches ([phone])    Current Level of Care: Hospital Recommended Level of Care: [place] (active with [facility] for outpatient palliative.) Prior Approval Number:    Date Approved/Denied:   PASRR Number: Manual review  Discharge Plan: SNF (Active with [facility] for outpatient palliative.)    Current Diagnoses: Patient Active Problem List   Diagnosis Date Noted  . HLD (hyperlipidemia) 12/30/2019  . Depression 12/30/2019  . Dementia ([place])   . Prostate cancer ([place])   . Cough   . UTI (urinary tract infection)   . Pulmonary nodules   . Severe sepsis with septic shock ([place]) 11/06/2019  . Chronic diarrhea 08/05/2019  . Closed right acetabular fracture ([place]) 08/05/2019  . Type 2 diabetes mellitus without complication ([place]) 03/30/8249  . Essential hypertension 08/05/2019  . Parkinson disease ([place]) 08/05/2019  . 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 Syncope 08/05/2019  . Hypotension 08/05/2019  . AKI (acute kidney injury) ([place]) 08/05/2019  . Acetabular fracture ([place]) 08/05/2019    Orientation RESPIRATION BLADDER Height & Weight     Self  Normal Incontinent Weight: 140 lb (63.5 kg) Height:  6' (182.9 cm)  BEHAVIORAL SYMPTOMS/MOOD NEUROLOGICAL BOWEL NUTRITION STATUS   (None)  (Parkinson's, Dementia) Continent Diet (Heart healthy)  AMBULATORY STATUS COMMUNICATION OF NEEDS Skin   Extensive Assist Verbally Normal                       Personal  Care Assistance Level of Assistance  Bathing, Feeding, Dressing Bathing Assistance: Maximum assistance Feeding assistance: Maximum assistance Dressing Assistance: Maximum assistance     Functional Limitations Info  Sight, Hearing, Speech Sight Info: Adequate Hearing Info: Adequate Speech Info: Adequate    SPECIAL CARE FACTORS FREQUENCY  PT (By licensed PT), OT (By licensed OT)     PT Frequency: 5 x week OT Frequency: 5 x week            Contractures Contractures Info: Not present    Additional Factors Info  Code Status, Allergies, Psychotropic Code Status Info: Full code Allergies Info: NKDA Psychotropic Info: Depression: Wellbutrin XL 150 mg PO daily, Paxil 40 mg PO daily, Seroquel 175 mg PO QHS.         Current Medications (01/02/2020):  This is the current hospital active medication list Current Facility-Administered Medications  Medication Dose Route Frequency Provider Last Rate Last Admin  . acetaminophen (TYLENOL) tablet 650 mg  650 mg Oral Q6H PRN Ivor Costa, MD      . albuterol (VENTOLIN HFA) 108 (90 Base) MCG/ACT inhaler 2 puff  2 puff Inhalation Q4H PRN Ivor Costa, MD      . aspirin EC tablet 81 mg  81 mg Oral Daily Ivor Costa, MD   81 mg at 01/01/20 0835  . atorvastatin (LIPITOR) tablet 40 mg  40 mg Oral Daily Ivor Costa, MD   40 mg at 01/01/20 0836  . buPROPion (WELLBUTRIN XL) 24 hr tablet 150 mg  150 mg Oral Daily Ivor Costa, MD  150 mg at 01/01/20 0836  . carbidopa-levodopa (SINEMET IR) 25-100 MG per tablet immediate release 1 tablet  1 tablet Oral QID Ivor Costa, MD   1 tablet at 01/01/20 2205  . cefTRIAXone (ROCEPHIN) 1 g in sodium chloride 0.9 % 100 mL IVPB  1 g Intravenous Q24H Ivor Costa, MD 200 mL/hr at 01/02/20 0524 1 g at 01/02/20 0524  . dextromethorphan-guaiFENesin (MUCINEX DM) 30-600 MG per 12 hr tablet 1 tablet  1 tablet Oral BID PRN Ivor Costa, MD      . enoxaparin (LOVENOX) injection 40 mg  40 mg Subcutaneous Q24H Ivor Costa, MD   40 mg at  01/01/20 0835  . folic acid (FOLVITE) tablet 3 mg  3 mg Oral Daily Ivor Costa, MD   3 mg at 01/01/20 0835  . insulin aspart (novoLOG) injection 0-5 Units  0-5 Units Subcutaneous QHS Ivor Costa, MD      . insulin aspart (novoLOG) injection 0-9 Units  0-9 Units Subcutaneous TID WC Ivor Costa, MD   1 Units at 12/31/19 1720  . memantine (NAMENDA) tablet 5 mg  5 mg Oral BID Ivor Costa, MD   5 mg at 01/01/20 2204  . [START ON 01/03/2020] Methotrexate SOSY 0.8 mL  0.8 mL Subcutaneous Q Judge Stall, Soledad Gerlach, MD      . ondansetron [facility]) injection 4 mg  4 mg Intravenous Q8H PRN Ivor Costa, MD      . PARoxetine (PAXIL) tablet 40 mg  40 mg Oral Daily Ivor Costa, MD   40 mg at 01/01/20 0836  . QUEtiapine (SEROQUEL) tablet 175 mg  175 mg Oral QHS Ivor Costa, MD   175 mg at 01/01/20 2204  . rivastigmine (EXELON) capsule 1.5 mg  1.5 mg Oral BID Ivor Costa, MD   1.5 mg at 01/01/20 2320  . tamsulosin (FLOMAX) capsule 0.4 mg  0.4 mg Oral Daily Ivor Costa, MD   0.4 mg at 01/01/20 5409     Discharge Medications: Please see discharge summary for a list of discharge medications.  Relevant Imaging Results:  Relevant Lab Results:   Additional Information SS#: [id number]  Candie Chroman, LCSW

## 2020-01-02 NOTE — Care Management Important Message (Signed)
Important Message  Patient Details  Name: Seth Mcpherson MRN: [id number] Date of Birth: September 25, 1945   Medicare Important Message Given:  Yes     Dannette Barbara 01/02/2020, 12:40 PM

## 2020-01-02 NOTE — Progress Notes (Signed)
Physical Therapy Treatment Patient Details Name: Seth Mcpherson MRN: [id number] DOB: 04-01-1946 Today's Date: 01/02/2020    History of Present Illness Seth Mcpherson is a 74 y.o. male with medical history significant of hypertension, hyperlipidemia, diabetes mellitus, depression, prostate cancer, Parkinson's disease, dementia, chronic diarrhea, recent admission due to sepsis 2/2 UTI and pneumonia, who presents with dysuria and burning on urination and cough.Seth Mcpherson is a 74 y.o. male with medical history significant of hypertension, hyperlipidemia, diabetes mellitus, depression, prostate cancer, Parkinson's disease, dementia, chronic diarrhea, recent admission due to sepsis 2/2 UTI and pneumonia, who presents with dysuria and burning on urination and cough. Pt admitted for severe sepsis.    PT Comments    Pt on right side with knees bent upon arrival.  Time spent stretching LE's prior to ex.  Participated in exercises as described below.  2 x 10 reps.  Transitioned to sitting with mod a x 1.  Pt with good effort to initiate transition but does need significant assist to complete transition to sitting.  Sat EOB x 10 minutes with min a x 1.  Overall improved with cues to flex at hips to limit post lean.  He is able to attempt lateral scoot to [facility] in sitting but needs max a x 1 to move.  Returned to supine with max a x 1.  Once in supine he is able to assist with scooting up in bed with min a x 1 and rolling left/right to change out wet pad.  If given time and cues, he is able to assist well with bed level mobility.   Follow Up Recommendations  SNF     Equipment Recommendations       Recommendations for Other Services       Precautions / Restrictions Precautions Precautions: Fall Required Braces or Orthoses: Other Brace Other Brace: Pt wears R lift shoe for all mobility. Restrictions Weight Bearing Restrictions: No    Mobility  Bed Mobility Overal bed mobility: Needs Assistance Bed  Mobility: Rolling;Supine to Sit;Sit to Supine Rolling: Min assist   Supine to sit: Mod assist Sit to supine: Max assist   General bed mobility comments: if given time, pt is able to assist.  Transfers Overall transfer level: Needs assistance Equipment used: None Transfers: Lateral/Scoot Transfers          Lateral/Scoot Transfers: Max assist General transfer comment: towards HOB in sitting  Ambulation/Gait                 Stairs             Wheelchair Mobility    Modified Rankin (Stroke Patients Only)       Balance Overall balance assessment: Needs assistance Sitting-balance support: Bilateral upper extremity supported;Feet supported Sitting balance-Leahy Scale: Poor Sitting balance - Comments: needs hand on assist at all times due to post lean and inabaility to correct imbalances without assist. Postural control: Posterior lean     Standing balance comment: Not tested.                            Cognition Arousal/Alertness: Awake/alert Behavior During Therapy: WFL for tasks assessed/performed Overall Cognitive Status: Within Functional Limits for tasks assessed                                        Exercises Other Exercises Other Exercises: BLE  AAROM 2 x 10 for available ranges in supine    General Comments        Pertinent Vitals/Pain Pain Assessment: No/denies pain    Home Living                      Prior Function            PT Goals (current goals can now be found in the care plan section) Progress towards PT goals: Progressing toward goals    Frequency    Min 2X/week      PT Plan Current plan remains appropriate    Co-evaluation              AM-PAC PT "6 Clicks" Mobility   Outcome Measure  Help needed turning from your back to your side while in a flat bed without using bedrails?: A Lot Help needed moving from lying on your back to sitting on the side of a flat bed without  using bedrails?: A Lot Help needed moving to and from a bed to a chair (including a wheelchair)?: Total Help needed standing up from a chair using your arms (e.g., wheelchair or bedside chair)?: Total Help needed to walk in hospital room?: Total Help needed climbing 3-5 steps with a railing? : Total 6 Click Score: 8    End of Session Equipment Utilized During Treatment: Gait belt Activity Tolerance: Patient tolerated treatment well Patient left: in bed;with bed alarm set;with family/visitor present;with call bell/phone within reach Nurse Communication: Mobility status       Time: 2707-8675 PT Time Calculation (min) (ACUTE ONLY): 27 min  Charges:  $Therapeutic Exercise: 8-22 mins $Therapeutic Activity: 8-22 mins                    Chesley Noon, PTA 01/02/20, 12:48 PM

## 2020-01-02 NOTE — TOC Progression Note (Addendum)
Transition of Care [facility]) - Progression Note    Patient Details  Name: Vanden Fawaz MRN: [id number] Date of Birth: 1945-12-17  Transition of Care [facility]) CM/SW [place], LCSW Phone Number: 01/02/2020, 11:43 AM  Clinical Narrative: Uploaded requested documents into [place] [facility] for PASARR review. Only bed offers are [facility] and [facility]. [facility] is starting Ship broker.    12:27 pm: PASARR obtained: [id number]. Expires 10/27.  4:43 pm: Insurance authorization approved. [facility] can take patient tomorrow and can accept COVID test from 9/24. CSW called to notify wife. She voiced concerns about possibility of being discharged from the hospital too early. CSW relayed concerns to MD.  Expected Discharge Plan: Skilled Nursing Facility Barriers to Discharge: [place] [facility]), Continued Medical Work up  Expected Discharge Plan and Services Expected Discharge Plan: [place] Choice: [place] arrangements for the past 2 months: Single Family Home                                       Social Determinants of Health (SDOH) Interventions    Readmission Risk Interventions No flowsheet data found.

## 2020-01-02 NOTE — Progress Notes (Signed)
PROGRESS NOTE    Seth Mcpherson  XNT:[id number] DOB: 1945/05/29 DOA: 12/30/2019 PCP: Seth Drought, MD    Brief Narrative:  HPI: Seth Mcpherson is a 74 y.o. male with medical history significant of hypertension, hyperlipidemia, diabetes mellitus, depression, prostate cancer, Parkinson's disease, dementia, chronic diarrhea, recent admission due to sepsis 2/2 UTI and pneumonia, who presents with dysuria and burning on urination and cough.  Patient was recently hospitalized from 8/1-8/3 due to sepsis secondary to UTI and possible pneumonia. Urine culture was positive for Proteus mirabilis. Patient was treated with IV Rocephin and azithromycin, and discharged on cefdinir.  Per patient's wife, patient has dysuria, burning on urination, increased urinary frequency in the past several days.  Patient has generalized weakness.  He also has dry cough, mild shortness breath, no chest pain.  No fever or chills.  Patient has history of IBS, with chronic intermittent mild diarrhea, which has not changed.  Today patient does not have nausea, vomiting, diarrhea or abdominal pain.  No facial droop or slurred speech. Wife states that pt is taking methotrexate for pemphigoid.  Patient mental status has improved and currently at baseline.  Transition to p.o. antibiotics.  Discharge planning initiated however disposition may be an issue.   Assessment & Plan:   Principal Problem:   UTI (urinary tract infection) Active Problems:   Type 2 diabetes mellitus without complication (HCC)   Essential hypertension   Parkinson disease (HCC)   AKI (acute kidney injury) ([place])   Severe sepsis with septic shock (HCC)   HLD (hyperlipidemia)   Depression   Dementia (HCC)   Prostate cancer (HCC)   Cough   Pulmonary nodules  Severe sepsis due to UTI Shock physiology resolved  Patient has leukocytosis with WBC 12.3, tachycardia with heart rate 133, elevated lactic acid 7.9 and AKI, meets criteria for severe sepsis.   Blood pressure responded to fluid resuscitation.  Shock physiology resolved  Urinalysis positive for UTI.   Patient had positive culture for Proteus mirabilis recently. Clinically appears improved over interval Urine culture demonstrating contaminant Repeat UA shows clearance of bacteria Plan: DC Rocephin Start cefdinir No need for IV fluids   Type 2 diabetes mellitus without complication (HCC)  Recent A1c 5.8, well controlled.  Patient taking Metformin at home -SSI  Essential hypertension: Blood pressures are soft -Hold blood pressure medications  Parkinson disease ([place]) -Sinemet  AKI (acute kidney injury) ([place]), resolved  Likely due to UTI -f/u by BMP -Currently baseline  HLD (hyperlipidemia): -Lipitor  Depression -Continue home medications  Dementia ([place]) -Rivastigmine and Namenda  Prostate cancer [facility]): Per patient's wife, patient did not have surgery, currently is not treated for prostate cancer. -Continue Flomax -Need to follow-up with urology  Cough: Etiology is not clear.  Chest x-ray and CT of chest did not show pneumonia.  Patient has hx of Parkinson's disease, will need SLP evaluation to r/o aspiration.  SLP evaluation complete.  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   Patient currently medically stable for discharge.  SNF bed search initiated.   Patient can discharge to skilled nursing facility once bed is found.   Consultants:   None  Procedures:   None  Antimicrobials:   Ceftriaxone   Subjective: Seen and examined.  No pain complaints.  Energy level improved.  Mental status baseline.  Objective: Vitals:   01/01/20 2002 01/02/20 0549 01/02/20 0800 01/02/20 1135  BP: 121/81 106/64 119/78 100/66  Pulse: 69 64 76 73  Resp: 20 16 14 14   Temp: 98 F (36.7 C) 97.9 F (36.6 C) 98.2 F (36.8 C) 98.6 F (37 C)  TempSrc: Oral Oral Oral Oral  SpO2: 98% 95% 96%   Weight:      Height:        Intake/Output Summary (Last 24 hours) at 01/02/2020 1137 Last data filed at 01/02/2020 0900 Gross per 24 hour  Intake 8 ml  Output 100 ml  Net -92 ml   Filed Weights   12/29/19 1931  Weight: 63.5 kg    Examination:  General: No apparent distress, patient appears frail HEENT: Normocephalic, atraumatic Neck, supple, trachea midline, no tenderness Heart: Regular rate and rhythm, S1/S2 normal, no murmurs Lungs: Mild bibasilar crackles.  Normal work of breathing.  Room air Abdomen: Soft, tender to palpation epigastrium, nondistended, positive bowel sounds Extremities: Normal, atraumatic, no clubbing or cyanosis, normal muscle tone Skin: No rashes or lesions, normal color Neurologic: Cranial nerves grossly intact.  Resting tremor noted.  Alert, oriented x3 Psychiatric: Normal affect     Data Reviewed: I have personally reviewed following labs and imaging studies  CBC: Recent Labs  Lab 12/29/19 1931 12/30/19 0542 12/31/19 0613 01/02/20 0417  WBC 7.6 12.3* 7.1 5.7  NEUTROABS  --  10.9*  --  3.3  HGB 13.6 14.7 11.1* 11.2*  HCT 42.0 44.1 32.8* 32.8*  MCV 89.9 87.7 86.5 86.5  PLT 232 257 186 213   Basic Metabolic Panel: Recent Labs  Lab 12/29/19 1931 12/30/19 0542 12/31/19 0613 01/02/20 0417  NA 138 139 137 140  K 4.3 4.7 3.7 3.7  CL 97* 100 100 102  CO2 26 18* 29 29  GLUCOSE 111* 157* 94 94  BUN 23 32*  19 17  CREATININE 0.88 1.42* 0.85 0.77  CALCIUM 9.2 9.9 8.6* 8.7*   GFR: Estimated Creatinine Clearance: 71.7 mL/min (by C-G formula based on SCr of 0.77 mg/dL). Liver Function Tests: Recent Labs  Lab 12/30/19 0542  AST 27  ALT 15  ALKPHOS 110  BILITOT 1.3*  PROT 8.2*  ALBUMIN 4.8   No results for input(s): LIPASE, AMYLASE in the last 168 hours. No results for input(s): AMMONIA in the last 168 hours. Coagulation Profile: Recent Labs  Lab 12/30/19 0542  INR 1.0   Cardiac Enzymes: No results for input(s): CKTOTAL, CKMB, CKMBINDEX, TROPONINI in the last 168 hours. BNP (last 3 results) No results for input(s): PROBNP in the last 8760 hours. HbA1C: No results for input(s): HGBA1C in the last 72 hours. CBG: Recent Labs  Lab 01/01/20 0751 01/01/20 1149 01/01/20 1632 01/01/20 2228 01/02/20 0750  GLUCAP 102* 120* 93 100* 87   Lipid Profile: No results for input(s): CHOL, HDL, LDLCALC, TRIG, CHOLHDL, LDLDIRECT in the last 72 hours. Thyroid Function Tests: No results for input(s): TSH, T4TOTAL, FREET4, T3FREE, THYROIDAB in the last  72 hours. Anemia Panel: No results for input(s): VITAMINB12, FOLATE, FERRITIN, TIBC, IRON, RETICCTPCT in the last 72 hours. Sepsis Labs: Recent Labs  Lab 12/30/19 0533 12/30/19 0849 12/30/19 1039  PROCALCITON  --  <0.10  --   LATICACIDVEN 7.9* 1.5 4.0*    Recent Results (from the past 240 hour(s))  Blood Culture (routine x 2)     Status: None (Preliminary result)   Collection Time: 12/30/19  5:31 AM   Specimen: BLOOD  Result Value Ref Range Status   Specimen Description BLOOD BLOOD LEFT FOREARM  Final   Special Requests   Final    BOTTLES DRAWN AEROBIC AND ANAEROBIC Blood Culture adequate volume   Culture   Final    NO GROWTH 3 DAYS Performed at [facility], [address], [place], [place] [address]    Report Status PENDING  Incomplete  Respiratory Panel by RT PCR (Flu A&B, Covid) - Nasopharyngeal Swab     Status: None    Collection Time: 12/30/19  5:31 AM   Specimen: Nasopharyngeal Swab  Result Value Ref Range Status   SARS Coronavirus 2 by RT PCR NEGATIVE NEGATIVE Final    Comment: (NOTE) SARS-CoV-2 target nucleic acids are NOT DETECTED.  The SARS-CoV-2 RNA is generally detectable in upper respiratoy specimens during the acute phase of infection. The lowest concentration of SARS-CoV-2 viral copies this assay can detect is 131 copies/mL. A negative result does not preclude SARS-Cov-2 infection and should not be used as the sole basis for treatment or other patient management decisions. A negative result may occur with  improper specimen collection/handling, submission of specimen other than nasopharyngeal swab, presence of viral mutation(s) within the areas targeted by this assay, and inadequate number of viral copies (<131 copies/mL). A negative result must be combined with clinical observations, patient history, and epidemiological information. The expected result is Negative.  Fact Sheet for Patients:  PinkCheek.be  Fact Sheet for Healthcare Providers:  GravelBags.it  This test is no t yet approved or cleared by the [place] FDA and  has been authorized for detection and/or diagnosis of SARS-CoV-2 by FDA under an Emergency Use Authorization (EUA). This EUA will remain  in effect (meaning this test can be used) for the duration of the COVID-19 declaration under Section 564(b)(1) of the Act, 21 U.S.C. section 360bbb-3(b)(1), unless the authorization is terminated or revoked sooner.     Influenza A by PCR NEGATIVE NEGATIVE Final   Influenza B by PCR NEGATIVE NEGATIVE Final    Comment: (NOTE) The Xpert Xpress SARS-CoV-2/FLU/RSV assay is intended as an aid in  the diagnosis of influenza from Nasopharyngeal swab specimens and  should not be used as a sole basis for treatment. Nasal washings and  aspirates are unacceptable for Xpert  Xpress SARS-CoV-2/FLU/RSV  testing.  Fact Sheet for Patients: PinkCheek.be  Fact Sheet for Healthcare Providers: GravelBags.it  This test is not yet approved or cleared by the [place] FDA and  has been authorized for detection and/or diagnosis of SARS-CoV-2 by  FDA under an Emergency Use Authorization (EUA). This EUA will remain  in effect (meaning this test can be used) for the duration of the  Covid-19 declaration under Section 564(b)(1) of the Act, 21  U.S.C. section 360bbb-3(b)(1), unless the authorization is  terminated or revoked. Performed at [facility], [address] [place], [place] [address]   Blood Culture (routine x 2)     Status: None (Preliminary result)   Collection Time: 12/30/19  5:33 AM  Specimen: BLOOD  Result Value Ref Range Status   Specimen Description BLOOD LEFT ANTECUBITAL  Final   Special Requests   Final    BOTTLES DRAWN AEROBIC AND ANAEROBIC Blood Culture results may not be optimal due to an excessive volume of blood received in culture bottles   Culture   Final    NO GROWTH 3 DAYS Performed at [facility], [address] [place], [place] [address]    Report Status PENDING  Incomplete  Urine culture     Status: Abnormal   Collection Time: 12/30/19  5:51 AM   Specimen: Urine, Random  Result Value Ref Range Status   Specimen Description   Final    URINE, RANDOM Performed at [facility], [address], [place], [place] [address]    Special Requests   Final    NONE Performed at [facility], [address] [place], [place] [address]    Culture MULTIPLE SPECIES PRESENT, SUGGEST RECOLLECTION (A)  Final   Report Status 12/31/2019 FINAL  Final         Radiology Studies: No results found.      Scheduled Meds: . aspirin EC  81 mg Oral Daily  . atorvastatin  40 mg Oral Daily  . buPROPion  150 mg Oral Daily  . carbidopa-levodopa   1 tablet Oral QID  . cefdinir  300 mg Oral Q12H  . enoxaparin (LOVENOX) injection  40 mg Subcutaneous Q24H  . folic acid  3 mg Oral Daily  . insulin aspart  0-5 Units Subcutaneous QHS  . insulin aspart  0-9 Units Subcutaneous TID WC  . memantine  5 mg Oral BID  . [START ON 01/03/2020] Methotrexate  0.8 mL Subcutaneous Q Tue  . PARoxetine  40 mg Oral Daily  . QUEtiapine  175 mg Oral QHS  . rivastigmine  1.5 mg Oral BID  . tamsulosin  0.4 mg Oral Daily   Continuous Infusions:    LOS: 3 days    Time spent: 15 minutes    Sidney Ace, MD [facility] Pager [phone]  If 7PM-7AM, please contact night-coverage 01/02/2020, 11:37 AM

## 2020-01-03 LAB — BASIC METABOLIC PANEL
Anion gap: 7 (ref 5–15)
BUN: 19 mg/dL (ref 8–23)
CO2: 30 mmol/L (ref 22–32)
Calcium: 8.8 mg/dL — ABNORMAL LOW (ref 8.9–10.3)
Chloride: 103 mmol/L (ref 98–111)
Creatinine, Ser: 0.71 mg/dL (ref 0.61–1.24)
GFR calc Af Amer: >60 mL/min (ref >60)
GFR calc non Af Amer: >60 mL/min (ref >60)
Glucose, Bld: 101 mg/dL — ABNORMAL HIGH (ref 70–99)
Potassium: 4 mmol/L (ref 3.5–5.1)
Sodium: 140 mmol/L (ref 135–145)

## 2020-01-03 LAB — CBC WITH DIFFERENTIAL/PLATELET
Abs Immature Granulocytes: 0.04 10*3/uL (ref 0.00–0.07)
Basophils Absolute: 0 10*3/uL (ref 0.0–0.1)
Basophils Relative: 1 %
Eosinophils Absolute: 0.3 10*3/uL (ref 0.0–0.5)
Eosinophils Relative: 4 %
HCT: 33.6 % — ABNORMAL LOW (ref 39.0–52.0)
Hemoglobin: 11.1 g/dL — ABNORMAL LOW (ref 13.0–17.0)
Immature Granulocytes: 1 %
Lymphocytes Relative: 17 %
Lymphs Abs: 1.1 10*3/uL (ref 0.7–4.0)
MCH: 29.7 pg (ref 26.0–34.0)
MCHC: 33 g/dL (ref 30.0–36.0)
MCV: 89.8 fL (ref 80.0–100.0)
Monocytes Absolute: 1 10*3/uL (ref 0.1–1.0)
Monocytes Relative: 17 %
Neutro Abs: 3.8 10*3/uL (ref 1.7–7.7)
Neutrophils Relative %: 60 %
Platelets: 186 10*3/uL (ref 150–400)
RBC: 3.74 MIL/uL — ABNORMAL LOW (ref 4.22–5.81)
RDW: 16.4 % — ABNORMAL HIGH (ref 11.5–15.5)
WBC: 6.3 10*3/uL (ref 4.0–10.5)
nRBC: 0 % (ref 0.0–0.2)

## 2020-01-03 LAB — GLUCOSE, CAPILLARY
Glucose-Capillary: 100 mg/dL — ABNORMAL HIGH (ref 70–99)
Glucose-Capillary: 113 mg/dL — ABNORMAL HIGH (ref 70–99)

## 2020-01-03 LAB — LACTIC ACID, PLASMA: Lactic Acid, Venous: 1.7 mmol/L (ref 0.5–1.9)

## 2020-01-03 MED ORDER — CEFDINIR 300 MG PO CAPS: 300.0000 mg | ORAL_CAPSULE | Freq: Two times a day (BID) | ORAL | 0 refills | Status: AC

## 2020-01-03 MED ORDER — FLEET ENEMA 7-19 GM/118ML RE ENEM
1.0000 | ENEMA | Freq: Once | RECTAL | Status: AC
  Administered 2020-01-03: 1 via RECTAL

## 2020-01-03 NOTE — Discharge Summary (Signed)
Physician Discharge Summary  Bernardo Brayman AQT:[id number] DOB: 24-Aug-1945 DOA: 12/30/2019  PCP: Marygrace Drought, MD  Admit date: 12/30/2019 Discharge date: 01/03/2020  Admitted From: Home Disposition: SNF  Recommendations for Outpatient Follow-up:  1. Follow up with PCP in 1-2 weeks 2. Referral sent to outpatient geriatrics 3. Referral sent to patient pulmonary nodule clinic  Home Health: No Equipment/Devices: None Discharge Condition: Stable CODE STATUS: DNR Diet recommendation: Heart Healthy Brief/Interim Summary: TGY:BWLSL Johnsonis a 74 y.o.malewith medical history significant ofhypertension, hyperlipidemia, diabetes mellitus, depression, prostate cancer, Parkinson's disease, dementia, chronic diarrhea, recent admission due to sepsis2/2UTI and pneumonia, who presents with dysuria and burning on urination and cough.  Patient was recently hospitalized from 8/1-8/3 due to sepsis secondary to UTI and possible pneumonia. Urine culture was positive for Proteus mirabilis. Patient was treated with IV Rocephin and azithromycin, and discharged on cefdinir.  Per patient's wife,patienthas dysuria, burning on urination, increased urinary frequency in the past several days. Patient has generalized weakness. He also has dry cough, mild shortness breath,no chest pain.No fever or chills. Patient has history of IBS, withchronic intermittent mild diarrhea, which has not changed. Today patient does not have nausea,vomiting, diarrhea or abdominal pain. No facial droop or slurred speech. Wife states that pt istaking methotrexate for pemphigoid.  Patient mental status has improved and currently at baseline.  Transition to p.o. antibiotics.    Patient tolerated transition to p.o. antibiotics well.  Lactic acid is normalized.  Transition to p.o. cefdinir to complete empiric 37-DSK course for complicated urinary tract infection.  Discussed at bedside with patient's wife at length today.  She  expressed concerns about his ability to recover effectively at skilled nursing facility.  I explained that in order to justify continued hospital stay we would have to demonstrate to the payer source that we are in acting care that cannot be accomplished at a lower level of care.  Patient's wife is well versed in medical insurance and understood.  She had questions regarding referral to geriatrics as well as further evaluation of his pulmonary nodules.  I have placed referrals to both outpatient geriatrics as well as an outpatient pulmonary nodule clinic.  Discharge Diagnoses:  Principal Problem:   UTI (urinary tract infection) Active Problems:   Type 2 diabetes mellitus without complication (HCC)   Essential hypertension   Parkinson disease (HCC)   AKI (acute kidney injury) ([place])   Severe sepsis with septic shock (HCC)   HLD (hyperlipidemia)   Depression   Dementia (HCC)   Prostate cancer (HCC)   Cough   Pulmonary nodules  Severe sepsisdue to UTI Shock physiology resolved Patient has leukocytosis with WBC 12.3, tachycardia with heart rate 133, elevated lactic acid 7.9 and AKI, meets criteria for severe sepsis.  Blood pressure responded to fluid resuscitation.  Shock physiology resolved Urinalysis positive for UTI.  Patient had positive culture for Proteus mirabilis recently. Clinically appears improved over interval Urine culture demonstrating contaminant Repeat UA shows clearance of bacteria Was on IV Rocephin in house Transition to p.o. cefdinir on discharge Complete empiric 10-day course   Type 2 diabetes mellitus without complication (HCC) Recent A1c 5.8, well controlled. Patient taking Metformin at home Can continue on discharge  Essential hypertension:Blood pressures are soft -Hold blood pressure medications.  Blood pressures are starting to come up at time of discharge.  Home blood pressure medications resumed on discharge  Parkinson disease  ([place]) -Sinemet  AKI (acute kidney injury) ([place]), resolved Likely due to UTI -f/u by BMP -Currently baseline  HLD (  hyperlipidemia): -Lipitor  Depression -Continue home medications  Dementia ([place]) -RivastigmineandNamenda  Prostate cancer (HCC):Per patient's wife, patient did not have surgery, currently is not treated for prostate cancer. -Continue Flomax -Need to follow-up with urology  Cough:Etiology is not clear. Chest x-ray and CT of chest did not show pneumonia. Patient has hx ofParkinson's disease, will need SLP evaluationto r/oaspiration.  SLP evaluation complete.  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   aspirin EC 81 MG tablet Take 81 mg by mouth daily.   atorvastatin 40 MG tablet Commonly known as: LIPITOR Take 40 mg by mouth daily.   buPROPion 150 MG 24 hr tablet Commonly known as: WELLBUTRIN XL Take 150 mg by mouth daily.   carbidopa-levodopa 25-100 MG tablet Commonly known as: SINEMET IR Take 1 tablet by mouth 4 (four) times daily.   cefdinir 300 MG capsule Commonly known as: OMNICEF Take 1 capsule (300 mg total) by mouth every 12 (twelve) hours for 5 days.   folic acid 1 MG  tablet Commonly known as: FOLVITE Take 3 mg by mouth daily.   memantine 5 MG tablet Commonly known as: NAMENDA Take 1 tablet by mouth 2 (two) times daily.   metFORMIN 1000 MG tablet Commonly known as: GLUCOPHAGE Take 1,000 mg by mouth 2 (two) times daily with a meal.   Methotrexate 25 MG/ML Sosy Inject 0.8 mLs into the skin every Tuesday.   PARoxetine 40 MG tablet Commonly known as: PAXIL Take 1 tablet (40 mg total) by mouth daily.   QUEtiapine 25 MG tablet Commonly known as: SEROQUEL Take 175 mg by mouth at bedtime.   rivastigmine 1.5 MG capsule Commonly known as: EXELON Take 1 capsule (1.5 mg total) by mouth 2 (two) times daily.   tamsulosin 0.4 MG Caps capsule Commonly known as: FLOMAX Take 0.4 mg by mouth daily.       Contact information for after-discharge care    [facility] .   Service: Skilled Nursing Contact information: [place] [phone]                 No Known Allergies  Consultations:  None   Procedures/Studies: DG Chest 2 View  Result Date: 12/29/2019 CLINICAL DATA:  Weakness.  Productive cough and fever. EXAM: CHEST - 2 VIEW COMPARISON:  11/06/2019 FINDINGS: Heart size and vascularity normal.  Negative for heart failure Elevated left hemidiaphragm with distended bowel below the diaphragm similar to the prior study. Mild left lower lobe airspace disease unchanged from the prior study. Probable atelectasis. No definite pneumonia or effusion on the lateral view. IMPRESSION: Elevated left hemidiaphragm with mild  left lower lobe atelectasis unchanged from the prior study. Electronically Signed   By: Franchot Gallo M.D.   On: 12/29/2019 20:10   CT Chest Wo Contrast  Result Date: 12/30/2019 CLINICAL DATA:  Pneumonia, effusion, or abscess suspected. EXAM: CT CHEST WITHOUT CONTRAST TECHNIQUE: Multidetector CT imaging of the chest was performed following the  standard protocol without IV contrast. COMPARISON:  Chest radiographs 04/27/2019 and 06/03/2019 FINDINGS: Cardiovascular: No significant vascular findings. Normal heart size. No pericardial effusion. Coronary artery and aortic atherosclerosis. Mediastinum/Nodes: No enlarged mediastinal or axillary lymph nodes. Thyroid gland and trachea demonstrate no significant findings. Small hiatal hernia. Lungs/Pleura: No consolidation. No pleural effusion. No pneumothorax. Mild scarring/fibrotic change in the right upper lobe and linear atelectasis.There is a 5 mm pulmonary nodule in the right upper lobe (series 3, image 46) there is an approximately 6 mm pulmonary nodule in the left upper lobe (series 3, image 30) in the right upper lobe. Irregular 4 mm nodule in the left upper lobe (image 21). Upper Abdomen: Partially imaged left renal cyst. Musculoskeletal: Remote compression fracture in the lower thoracic spine. Multilevel degenerative change of the spine. Dextrocurvature. IMPRESSION: 1. Linear atelectasis in the right lower lobe. Otherwise, no acute cardiopulmonary abnormality. 2. Multiple pulmonary nodules, measuring up to 6 mm. Non-contrast chest CT at 3-6 months is recommended. If the nodules are stable at time of repeat CT, then future CT at 18-24 months (from today's scan) is considered optional for low-risk patients, but is recommended for high-risk patients. This recommendation follows the consensus statement: Guidelines for Management of Incidental Pulmonary Nodules Detected on CT Images: From the Fleischner Society 2017; Radiology 2017; 284:228-243. Electronically Signed   By: Margaretha Sheffield MD   On: 12/30/2019 07:33    (Echo, Carotid, EGD, Colonoscopy, ERCP)    Subjective: Seen and examined on day of discharge.  Mental status at baseline.  Speaking in complete sentences.  Wife at bedside.  Stable for discharge to skilled nursing facility at this time.  Discharge Exam: Vitals:   01/03/20 0910  01/03/20 1152  BP: 112/78 117/70  Pulse: 65 75  Resp: 15   Temp: 98.4 F (36.9 C) 98 F (36.7 C)  SpO2: 94% 95%   Vitals:   01/02/20 2038 01/03/20 0535 01/03/20 0910 01/03/20 1152  BP: 119/80 115/82 112/78 117/70  Pulse: 72 79 65 75  Resp: 18 16 15    Temp: 98.3 F (36.8 C) 97.9 F (36.6 C) 98.4 F (36.9 C) 98 F (36.7 C)  TempSrc: Oral Oral Oral Oral  SpO2: 96% 96% 94% 95%  Weight:      Height:        General: Pt is alert, awake, not in acute distress Cardiovascular: RRR, S1/S2 +, no rubs, no gallops Respiratory: CTA bilaterally, no wheezing, no rhonchi Abdominal: Soft, NT, ND, bowel sounds + Extremities: no edema, no cyanosis    The results of significant diagnostics from this hospitalization (including imaging, microbiology, ancillary and laboratory) are listed below for reference.     Microbiology: Recent Results (from the past 240 hour(s))  Blood Culture (routine x 2)     Status: None (Preliminary result)   Collection Time: 12/30/19  5:31 AM   Specimen: BLOOD  Result Value Ref Range Status   Specimen Description BLOOD BLOOD LEFT FOREARM  Final   Special Requests   Final    BOTTLES DRAWN AEROBIC AND ANAEROBIC Blood Culture adequate volume   Culture   Final    NO GROWTH 4 DAYS Performed at  [facility], [address] [place], [place] [address]    Report Status PENDING  Incomplete  Respiratory Panel by RT PCR (Flu A&B, Covid) - Nasopharyngeal Swab     Status: None   Collection Time: 12/30/19  5:31 AM   Specimen: Nasopharyngeal Swab  Result Value Ref Range Status   SARS Coronavirus 2 by RT PCR NEGATIVE NEGATIVE Final    Comment: (NOTE) SARS-CoV-2 target nucleic acids are NOT DETECTED.  The SARS-CoV-2 RNA is generally detectable in upper respiratoy specimens during the acute phase of infection. The lowest concentration of SARS-CoV-2 viral copies this assay can detect is 131 copies/mL. A negative result does not preclude SARS-Cov-2 infection and  should not be used as the sole basis for treatment or other patient management decisions. A negative result may occur with  improper specimen collection/handling, submission of specimen other than nasopharyngeal swab, presence of viral mutation(s) within the areas targeted by this assay, and inadequate number of viral copies (<131 copies/mL). A negative result must be combined with clinical observations, patient history, and epidemiological information. The expected result is Negative.  Fact Sheet for Patients:  PinkCheek.be  Fact Sheet for Healthcare Providers:  GravelBags.it  This test is no t yet approved or cleared by the [place] FDA and  has been authorized for detection and/or diagnosis of SARS-CoV-2 by FDA under an Emergency Use Authorization (EUA). This EUA will remain  in effect (meaning this test can be used) for the duration of the COVID-19 declaration under Section 564(b)(1) of the Act, 21 U.S.C. section 360bbb-3(b)(1), unless the authorization is terminated or revoked sooner.     Influenza A by PCR NEGATIVE NEGATIVE Final   Influenza B by PCR NEGATIVE NEGATIVE Final    Comment: (NOTE) The Xpert Xpress SARS-CoV-2/FLU/RSV assay is intended as an aid in  the diagnosis of influenza from Nasopharyngeal swab specimens and  should not be used as a sole basis for treatment. Nasal washings and  aspirates are unacceptable for Xpert Xpress SARS-CoV-2/FLU/RSV  testing.  Fact Sheet for Patients: PinkCheek.be  Fact Sheet for Healthcare Providers: GravelBags.it  This test is not yet approved or cleared by the [place] FDA and  has been authorized for detection and/or diagnosis of SARS-CoV-2 by  FDA under an Emergency Use Authorization (EUA). This EUA will remain  in effect (meaning this test can be used) for the duration of the  Covid-19 declaration  under Section 564(b)(1) of the Act, 21  U.S.C. section 360bbb-3(b)(1), unless the authorization is  terminated or revoked. Performed at [facility], [address] [place], [place] [address]   Blood Culture (routine x 2)     Status: None (Preliminary result)   Collection Time: 12/30/19  5:33 AM   Specimen: BLOOD  Result Value Ref Range Status   Specimen Description BLOOD LEFT ANTECUBITAL  Final   Special Requests   Final    BOTTLES DRAWN AEROBIC AND ANAEROBIC Blood Culture results may not be optimal due to an excessive volume of blood received in culture bottles   Culture   Final    NO GROWTH 4 DAYS Performed at [facility], [address] [place], [place] [address]    Report Status PENDING  Incomplete  Urine culture     Status: Abnormal   Collection Time: 12/30/19  5:51 AM   Specimen: Urine, Random  Result Value Ref Range Status   Specimen Description   Final    URINE, RANDOM Performed at [facility]  [address] [place], [place] [address]    Special Requests   Final    NONE Performed at [facility], [address] [place], [place] [address]    Culture MULTIPLE SPECIES PRESENT, SUGGEST RECOLLECTION (A)  Final   Report Status 12/31/2019 FINAL  Final     Labs: BNP (last 3 results) No results for input(s): BNP in the last 8760 hours. Basic Metabolic Panel: Recent Labs  Lab 12/29/19 1931 12/30/19 0542 12/31/19 0613 01/02/20 0417 01/03/20 0642  NA 138 139 137 140 140  K 4.3 4.7 3.7 3.7 4.0  CL 97* 100 100 102 103  CO2 26 18* 29 29 30   GLUCOSE 111* 157* 94 94 101*  BUN 23 32* 19 17 19   CREATININE 0.88 1.42* 0.85 0.77 0.71  CALCIUM 9.2 9.9 8.6* 8.7* 8.8*   Liver Function Tests: Recent Labs  Lab 12/30/19 0542  AST 27  ALT 15  ALKPHOS 110  BILITOT 1.3*  PROT 8.2*  ALBUMIN 4.8   No results for input(s): LIPASE, AMYLASE in the last 168 hours. No results for input(s): AMMONIA in the last 168 hours. CBC: Recent  Labs  Lab 12/29/19 1931 12/30/19 0542 12/31/19 0613 01/02/20 0417 01/03/20 0642  WBC 7.6 12.3* 7.1 5.7 6.3  NEUTROABS  --  10.9*  --  3.3 3.8  HGB 13.6 14.7 11.1* 11.2* 11.1*  HCT 42.0 44.1 32.8* 32.8* 33.6*  MCV 89.9 87.7 86.5 86.5 89.8  PLT 232 257 186 194 186   Cardiac Enzymes: No results for input(s): CKTOTAL, CKMB, CKMBINDEX, TROPONINI in the last 168 hours. BNP: Invalid input(s): POCBNP CBG: Recent Labs  Lab 01/02/20 1212 01/02/20 1633 01/02/20 2039 01/03/20 0810 01/03/20 1155  GLUCAP 90 142* 109* 100* 113*   D-Dimer No results for input(s): DDIMER in the last 72 hours. Hgb A1c No results for input(s): HGBA1C in the last 72 hours. Lipid Profile No results for input(s): CHOL, HDL, LDLCALC, TRIG, CHOLHDL, LDLDIRECT in the last 72 hours. Thyroid function studies No results for input(s): TSH, T4TOTAL, T3FREE, THYROIDAB in the last 72 hours.  Invalid input(s): FREET3 Anemia work up No results for input(s): VITAMINB12, FOLATE, FERRITIN, TIBC, IRON, RETICCTPCT in the last 72 hours. Urinalysis    Component Value Date/Time   COLORURINE YELLOW (A) 01/01/2020 1250   APPEARANCEUR CLEAR (A) 01/01/2020 1250   LABSPEC 1.016 01/01/2020 1250   PHURINE 6.0 01/01/2020 1250   GLUCOSEU NEGATIVE 01/01/2020 1250   HGBUR NEGATIVE 01/01/2020 1250   BILIRUBINUR NEGATIVE 01/01/2020 1250   KETONESUR 5 (A) 01/01/2020 1250   PROTEINUR NEGATIVE 01/01/2020 1250   NITRITE NEGATIVE 01/01/2020 1250   LEUKOCYTESUR TRACE (A) 01/01/2020 1250   Sepsis Labs Invalid input(s): PROCALCITONIN,  WBC,  LACTICIDVEN Microbiology Recent Results (from the past 240 hour(s))  Blood Culture (routine x 2)     Status: None (Preliminary result)   Collection Time: 12/30/19  5:31 AM   Specimen: BLOOD  Result Value Ref Range Status   Specimen Description BLOOD BLOOD LEFT FOREARM  Final   Special Requests   Final    BOTTLES DRAWN AEROBIC AND ANAEROBIC Blood Culture adequate volume   Culture   Final     NO GROWTH 4 DAYS Performed at [facility], [address] [place], [place] [address]    Report Status PENDING  Incomplete  Respiratory Panel by RT PCR (Flu A&B, Covid) - Nasopharyngeal Swab     Status: None   Collection Time: 12/30/19  5:31 AM   Specimen: Nasopharyngeal  Swab  Result Value Ref Range Status   SARS Coronavirus 2 by RT PCR NEGATIVE NEGATIVE Final    Comment: (NOTE) SARS-CoV-2 target nucleic acids are NOT DETECTED.  The SARS-CoV-2 RNA is generally detectable in upper respiratoy specimens during the acute phase of infection. The lowest concentration of SARS-CoV-2 viral copies this assay can detect is 131 copies/mL. A negative result does not preclude SARS-Cov-2 infection and should not be used as the sole basis for treatment or other patient management decisions. A negative result may occur with  improper specimen collection/handling, submission of specimen other than nasopharyngeal swab, presence of viral mutation(s) within the areas targeted by this assay, and inadequate number of viral copies (<131 copies/mL). A negative result must be combined with clinical observations, patient history, and epidemiological information. The expected result is Negative.  Fact Sheet for Patients:  PinkCheek.be  Fact Sheet for Healthcare Providers:  GravelBags.it  This test is no t yet approved or cleared by the [place] FDA and  has been authorized for detection and/or diagnosis of SARS-CoV-2 by FDA under an Emergency Use Authorization (EUA). This EUA will remain  in effect (meaning this test can be used) for the duration of the COVID-19 declaration under Section 564(b)(1) of the Act, 21 U.S.C. section 360bbb-3(b)(1), unless the authorization is terminated or revoked sooner.     Influenza A by PCR NEGATIVE NEGATIVE Final   Influenza B by PCR NEGATIVE NEGATIVE Final    Comment: (NOTE) The Xpert Xpress  SARS-CoV-2/FLU/RSV assay is intended as an aid in  the diagnosis of influenza from Nasopharyngeal swab specimens and  should not be used as a sole basis for treatment. Nasal washings and  aspirates are unacceptable for Xpert Xpress SARS-CoV-2/FLU/RSV  testing.  Fact Sheet for Patients: PinkCheek.be  Fact Sheet for Healthcare Providers: GravelBags.it  This test is not yet approved or cleared by the [place] FDA and  has been authorized for detection and/or diagnosis of SARS-CoV-2 by  FDA under an Emergency Use Authorization (EUA). This EUA will remain  in effect (meaning this test can be used) for the duration of the  Covid-19 declaration under Section 564(b)(1) of the Act, 21  U.S.C. section 360bbb-3(b)(1), unless the authorization is  terminated or revoked. Performed at [facility], [address] [place], [place] [address]   Blood Culture (routine x 2)     Status: None (Preliminary result)   Collection Time: 12/30/19  5:33 AM   Specimen: BLOOD  Result Value Ref Range Status   Specimen Description BLOOD LEFT ANTECUBITAL  Final   Special Requests   Final    BOTTLES DRAWN AEROBIC AND ANAEROBIC Blood Culture results may not be optimal due to an excessive volume of blood received in culture bottles   Culture   Final    NO GROWTH 4 DAYS Performed at [facility], [address], [place], [place] [address]    Report Status PENDING  Incomplete  Urine culture     Status: Abnormal   Collection Time: 12/30/19  5:51 AM   Specimen: Urine, Random  Result Value Ref Range Status   Specimen Description   Final    URINE, RANDOM Performed at [facility], [address] [place], [place] [address]    Special Requests   Final    NONE Performed at [facility], [address] [place],  [address]    Culture MULTIPLE SPECIES PRESENT, SUGGEST RECOLLECTION (A)  Final   Report Status  12/31/2019 FINAL  Final     Time coordinating discharge: Over 30 minutes  SIGNED:   Sidney Ace, MD  [facility] 01/03/2020, 12:21 PM Pager   If 7PM-7AM, please contact night-coverage

## 2020-01-03 NOTE — TOC Transition Note (Signed)
Transition of Care [facility]) - CM/SW Discharge Note   Patient Details  Name: Minas Bonser MRN: [id number] Date of Birth: 04-Jun-1945  [facility] CM/SW Contact:  Meriel Flavors, LCSW Phone Number: 01/03/2020, 3:37 PM   Clinical Narrative:    CSW spoke with patient's wife today to confirm patient is discharging to [facility] today RM# [id number]. Patient awaiting transport via EMS.     Barriers to Discharge: [place] (PASRR), Continued Medical Work up   Patient Goals and CMS Choice Patient states their goals for this hospitalization and ongoing recovery are:: "get strong enough to come back home" CMS Medicare.gov Compare Post Acute Care list provided to:: Patient Represenative (must comment) (Patient's wife) Choice offered to / list presented to : Spouse, Patient  Discharge Placement                       Discharge Plan and Services     Post Acute Care Choice: [facility]                               Social Determinants of Health (SDOH) Interventions     Readmission Risk Interventions No flowsheet data found.

## 2020-01-03 NOTE — Progress Notes (Signed)
Seth Mcpherson and O x1. VSS. Pt tolerating diet well. No complaints of nausea or vomiting. IV removed intact, prescriptions given. Discharge instructions sent with patient. Patient discharged via EMS.  Allergies as of 01/03/2020   No Known Allergies     Medication List    TAKE these medications   acetaminophen 500 MG tablet Commonly known as: TYLENOL Take 1,000 mg by mouth at bedtime.   aspirin EC 81 MG tablet Take 81 mg by mouth daily.   atorvastatin 40 MG tablet Commonly known as: LIPITOR Take 40 mg by mouth daily.   buPROPion 150 MG 24 hr tablet Commonly known as: WELLBUTRIN XL Take 150 mg by mouth daily.   carbidopa-levodopa 25-100 MG tablet Commonly known as: SINEMET IR Take 1 tablet by mouth 4 (four) times daily.   cefdinir 300 MG capsule Commonly known as: OMNICEF Take 1 capsule (300 mg total) by mouth every 12 (twelve) hours for 5 days.   folic acid 1 MG tablet Commonly known as: FOLVITE Take 3 mg by mouth daily.   memantine 5 MG tablet Commonly known as: NAMENDA Take 1 tablet by mouth 2 (two) times daily.   metFORMIN 1000 MG tablet Commonly known as: GLUCOPHAGE Take 1,000 mg by mouth 2 (two) times daily with Mcpherson meal.   Methotrexate 25 MG/ML Sosy Inject 0.8 mLs into the skin every Tuesday.   PARoxetine 40 MG tablet Commonly known as: PAXIL Take 1 tablet (40 mg total) by mouth daily.   QUEtiapine 25 MG tablet Commonly known as: SEROQUEL Take 175 mg by mouth at bedtime.   rivastigmine 1.5 MG capsule Commonly known as: EXELON Take 1 capsule (1.5 mg total) by mouth 2 (two) times daily.   tamsulosin 0.4 MG Caps capsule Commonly known as: FLOMAX Take 0.4 mg by mouth daily.       Vitals:   01/03/20 0910 01/03/20 1152  BP: 112/78 117/70  Pulse: 65 75  Resp: 15   Temp: 98.4 F (36.9 C) 98 F (36.7 C)  SpO2: 94% 95%    Seth Mcpherson

## 2020-01-03 NOTE — Progress Notes (Signed)
Occupational Therapy Treatment Patient Details Name: Seth Mcpherson MRN: [id number] DOB: Oct 21, 1945 Today's Date: 01/03/2020    History of present illness Seth Mcpherson is a 74 y.o. male with medical history significant of hypertension, hyperlipidemia, diabetes mellitus, depression, prostate cancer, Parkinson's disease, dementia, chronic diarrhea, recent admission due to sepsis 2/2 UTI and pneumonia, who presents with dysuria and burning on urination and cough. Pt admitted for severe sepsis.    OT comments  [name] was seen for OT treatment on this date. Upon arrival to room pt awake semi-reclined in bed c wife at bedside and breakfast completed. Pt agreeable to EOB grooming session. Pt currently requires MOD A to exit R side of bed - assist for trunk support, requires increased time for BLE mgmt. Pt initially requires MOD A static sitting balance 2/2 posterior lean improving to CGA. SETUP + MOD A for tooth brushing seated EOB - pt opens toothpaste, MIN A to place toothpaste on toothbrush 2/2 LUE tremor; MOD A to maintain sitting balance during tooth brushing - intermittently able to maintain dynamic sitting balance c CGA, decreasing to MOD A c fatigue. Pt reports fatigue during seated grooming as 6/10. Pt making good progress toward goals. Pt continues to benefit from skilled OT services to maximize return to PLOF and minimize risk of future falls, injury, caregiver burden, and readmission. Will continue to follow POC. Discharge recommendation remains appropriate.    Follow Up Recommendations  SNF;Supervision/Assistance - 24 hour    Equipment Recommendations  3 in 1 bedside commode    Recommendations for Other Services      Precautions / Restrictions Precautions Precautions: Fall Required Braces or Orthoses: Other Brace Other Brace: Pt wears R lift shoe for all mobility. Restrictions Weight Bearing Restrictions: No       Mobility Bed Mobility Overal bed mobility: Needs  Assistance Bed Mobility: Rolling;Supine to Sit;Sit to Supine Rolling: Min assist   Supine to sit: Mod assist Sit to supine: Mod assist   General bed mobility comments: Cues and increased time to assist  Transfers    General transfer comment: not tested    Balance Overall balance assessment: Needs assistance Sitting-balance support: Bilateral upper extremity supported;Feet supported Sitting balance-Leahy Scale: Fair   Postural control: Posterior lean        ADL either performed or assessed with clinical judgement   ADL Overall ADL's : Needs assistance/impaired    General ADL Comments: SETUP + MOD A for tooth brushing seated EOB - pt opens toothpaste, assist place toothpaste on toothbrush 2/2 LUE tremor; MOD A to maintain sitting balance during tooth brushing - intermittently able to maintain dynamic sitting balance c CGA, decreasing to MOD A c fatigue. Pt reports fatigue during seated grooming as 6/10.                Cognition Arousal/Alertness: Awake/alert Behavior During Therapy: WFL for tasks assessed/performed Overall Cognitive Status: Within Functional Limits for tasks assessed        General Comments: Follows multistep commands consistently c cues and increased time        Exercises Exercises: Other exercises Other Exercises Other Exercises: Pt and spouse educated re: d/c recs, HEP, ECS, falls prevention Other Exercises: Tooth brushing, bed mobility, sup<>sit, sitting/standing balance/tolerance, R+L rolling           Pertinent Vitals/ Pain       Pain Assessment: No/denies pain         Frequency  Min 1X/week        Progress  Toward Goals  OT Goals(current goals can now be found in the care plan section)  Progress towards OT goals: Progressing toward goals  Acute Rehab OT Goals Patient Stated Goal: to get stronger so I can get back to birdwatching OT Goal Formulation: With patient/family Time For Goal Achievement: 01/14/20 Potential to  Achieve Goals: Good ADL Goals Pt Will Perform Eating: sitting;with min assist;with adaptive utensils Pt Will Perform Grooming: sitting;with min assist Pt Will Transfer to Toilet: bedside commode;with min assist;with +2 assist;squat pivot transfer (c LRAD PRN for improved safety.) Pt Will Perform Toileting - Clothing Manipulation and hygiene: sitting/lateral leans;with mod assist (c LRAD PRN for improved safety.)  Plan Discharge plan remains appropriate;Frequency remains appropriate       AM-PAC OT "6 Clicks" Daily Activity     Outcome Measure   Help from another person eating meals?: A Little Help from another person taking care of personal grooming?: A Lot Help from another person toileting, which includes using toliet, bedpan, or urinal?: A Lot Help from another person bathing (including washing, rinsing, drying)?: A Lot Help from another person to put on and taking off regular upper body clothing?: A Little Help from another person to put on and taking off regular lower body clothing?: A Lot 6 Click Score: 14    End of Session    OT Visit Diagnosis: Other abnormalities of gait and mobility (R26.89);Muscle weakness (generalized) (M62.81)   Activity Tolerance Patient tolerated treatment well   Patient Left in bed;with call bell/phone within reach;with bed alarm set;with family/visitor present   Nurse Communication          Time: 8786-7672 OT Time Calculation (min): 23 min  Charges: OT General Charges $OT Visit: 1 Visit OT Treatments $Self Care/Home Management : 8-22 mins $Therapeutic Activity: 8-22 mins  Dessie Coma, M.S. OTR/L  01/03/20, 10:30 AM  ascom [phone]

## 2020-01-04 LAB — CULTURE, BLOOD (ROUTINE X 2)
Culture: NO GROWTH
Culture: NO GROWTH
Special Requests: ADEQUATE

## 2020-02-07 ENCOUNTER — Other Ambulatory Visit: Payer: Self-pay | Admitting: Oncology

## 2020-02-07 DIAGNOSIS — R918 Other nonspecific abnormal finding of lung field: Secondary | ICD-10-CM

## 2020-02-07 NOTE — Progress Notes (Signed)
  Pulmonary Nodule Clinic Telephone Note [place]   Received referral from [facility] emergency department.  HPI: Seth Mcpherson is a 74 year old male with past medical history significant for hypertension, hyperlipidemia, diabetes, depression, prostate cancer, Parkinson's disease, dementia, chronic diarrhea who presented to the emergency room on 01/03/2020 for dysuria and cough.  Imaging of his chest revealed several pulmonary nodules measuring between 4 and 6 mm in size.  A noncontrast chest CT is recommended in 3 to 6 months.  Review and Recommendations: I personally reviewed all patient's previous imaging including most recent CT chest from 12/30/2019.  I recommend follow-up with noncontrast chest CT in approximately 4 months.  Social History:   Tobacco Use: Medium Risk  . Smoking Tobacco Use: Former Smoker  . Smokeless Tobacco Use: Never Used     High risk factors include: History of heavy smoking, exposure to asbestos, radium or uranium, personal family history of lung cancer, older age, sex (females greater than males), race (black and native [place] greater than weight), marginal speculation, upper lobe location, multiplicity (less than 5 nodules increases risk for malignancy) and emphysema and/or pulmonary fibrosis.   This recommendation follows the consensus statement: Guidelines for Management of Incidental Pulmonary Nodules Detected on CT Images: From the Fleischner Society 2017; Radiology 2017; 284:228-243.    I have placed order for CT scan without contrast to be completed approximately 3 to 4 months from previous.  Disposition: Order placed for repeat CT chest. Will notify [place] in scheduling. [place] to call patient with appointment date and time. Return to pulmonary nodule clinic a few days after his repeat imaging to discuss results and plan moving forward.  Faythe Casa, NP 02/07/2020 3:47 PM

## 2020-03-12 ENCOUNTER — Encounter: Payer: Self-pay | Admitting: *Deleted

## 2020-04-10 ENCOUNTER — Telehealth: Payer: Self-pay | Admitting: *Deleted

## 2020-04-10 NOTE — Telephone Encounter (Signed)
Spoke with pt's wife, Olegario Messier, to inform of referral to the lung nodule clinic and upcoming appts. At this time, pt is not doing well per his wife and is at [facility]. Informed Olegario Messier that will call back a week before his appts to see how pt is doing and if appts need to be adjusted. Contact info given and instructed to call back with any further questions or needs. Olegario Messier verbalized understanding.

## 2020-05-03 ENCOUNTER — Telehealth: Payer: Self-pay | Admitting: *Deleted

## 2020-05-03 NOTE — Telephone Encounter (Signed)
Upcoming appts for the lung nodule clinic have been confirmed with pt's wife. Nothing further needed at this time.

## 2020-05-11 ENCOUNTER — Other Ambulatory Visit: Payer: Self-pay

## 2020-05-11 ENCOUNTER — Ambulatory Visit
Admission: RE | Admit: 2020-05-11 | Discharge: 2020-05-11 | Disposition: A | Discharge status: Home / Self Care | Payer: Medicare HMO | Source: Ambulatory Visit | Attending: Oncology | Admitting: Oncology

## 2020-05-11 DIAGNOSIS — R918 Other nonspecific abnormal finding of lung field: Secondary | ICD-10-CM | POA: Diagnosis not present

## 2020-05-14 ENCOUNTER — Inpatient Hospital Stay: Payer: Medicare HMO | Attending: Oncology | Admitting: Oncology

## 2020-05-14 ENCOUNTER — Telehealth: Payer: Self-pay | Admitting: Oncology

## 2020-05-14 ENCOUNTER — Inpatient Hospital Stay: Payer: Medicare HMO | Admitting: Oncology

## 2020-05-14 DIAGNOSIS — F0391 Unspecified dementia with behavioral disturbance: Secondary | ICD-10-CM

## 2020-05-14 DIAGNOSIS — R918 Other nonspecific abnormal finding of lung field: Secondary | ICD-10-CM | POA: Insufficient documentation

## 2020-05-14 DIAGNOSIS — G20 Parkinson's disease: Secondary | ICD-10-CM | POA: Diagnosis not present

## 2020-05-14 DIAGNOSIS — Z8546 Personal history of malignant neoplasm of prostate: Secondary | ICD-10-CM | POA: Diagnosis not present

## 2020-05-14 NOTE — Progress Notes (Signed)
Pulmonary Nodule Clinic Consult note [facility]  Telephone:([phone] [phone]  Patient Care Team: Marygrace Drought, MD as PCP - General (Family Medicine) Berneta Sages, NP as Nurse Practitioner (Adult Health Nurse Practitioner)   Name of the patient: Quamain Czarnecki  DR:[id number]  1945/07/11   Date of visit: 05/14/2020   Diagnosis- Lung Nodule  Virtual Visit via Telephone Note   I connected with Mrs. Bartolomucci on 05/16/20 at 11am  by telephone visit and verified that I am speaking with the correct person using two identifiers.   I discussed the limitations, risks, security and privacy concerns of performing an evaluation and management service by telemedicine and the availability of in-person appointments. I also discussed with the patient that there may be a patient responsible charge related to this service. The patient expressed understanding and agreed to proceed.   Other persons participating in the visit and their role in the encounter: Wife  Patient's location: Radiation protection practitioner  Provider's location: Clinic   Chief complaint/ Reason for visit- Pulmonary Nodule Clinic Initial Visit  Past Medical History:  Mr. Hohman is a 75 year old male with past medical history significant for hypertension, hyperlipidemia, diabetes, depression, prostate cancer, Parkinson's disease, dementia, chronic diarrhea who presented to the emergency room on 01/03/2020 for dysuria and cough.  Imaging of his chest revealed several pulmonary nodules measuring between 4 and 6 mm in size.  A noncontrast chest CT is recommended in 3 to 6 months.  Interval history-Mr. Debusk is currently living at [facility].  His wife is speaking on his behalf.  She is POA.  Mr. Letter is currently doing fair.  His wife states that he has slowly been declining over the past couple years.  Unfortunately, had a fall back in April 2021 and has not fully recovered.  While hospitalized,  recovering from his fall he developed secondary infection and was treated for sepsis.  She attempted to bring him home but unfortunately was unable to care for him on her own.  She hired help but cannot afford 24/7 care.  He is incontinent of both bowel and bladder.  She states he appears to be doing better at [employer].  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  He retired several years back due to his Parkinson's and dementia.  Denies any occupational exposures.  Did smoke on and off for about 12 years; 1ppd.  He quit in 1997.  Wife states he was a "sporadic smoker".  His mentation is not great.  His short-term memory is poor.  He can remember things from several years back but nothing recent.  He is essentially bedbound.   His wife is asking for a referral back to urology for elevated PSA and neurology for Parkinson's and dementia.  They are new to the area and unfortunately have not connected with a physician.  Wife states there is a strong family history for dementia but no real cancers.  His mother died from Alzheimer's and dad passed away from Lewy body.  She denies that he has had any recent fevers or illness, easy bleeding or bruising.  He has eating and drinking well.  He occasionally states he has chest pain.  Denies any nausea, vomiting, constipation or diarrhea.   ECOG FS:4 - Bedbound  Review of systems- Review of Systems  Constitutional: Positive for malaise/fatigue. Negative for chills, fever and weight loss.  HENT: Negative for congestion, ear pain and tinnitus.   Eyes: Negative.  Negative for blurred vision and  double vision.  Respiratory: Negative.  Negative for cough, sputum production and shortness of breath.   Cardiovascular: Negative.  Negative for chest pain, palpitations and leg swelling.  Gastrointestinal: Negative.  Negative for abdominal pain, constipation, diarrhea, nausea and vomiting.  Genitourinary: Negative for dysuria, frequency and urgency.  Musculoskeletal:  Positive for falls. Negative for back pain.  Skin: Negative.  Negative for rash.  Neurological: Positive for weakness. Negative for headaches.  Endo/Heme/Allergies: Negative.  Does not bruise/bleed easily.  Psychiatric/Behavioral: Positive for hallucinations and memory loss. Negative for depression. The patient is not nervous/anxious and does not have insomnia.      No Known Allergies   Past Medical History:  Diagnosis Date  . Cancer ([place])   . Dementia ([place])   . Diabetes mellitus without complication ([place])   . Hypertension   . Parkinson disease ([place])   . Prostate cancer [facility])      Past Surgical History:  Procedure Laterality Date  . CLOSED REDUCTION CLAVICLE FRACTURE      Social History   Socioeconomic History  . Marital status: Married    Spouse name: Not on file  . Number of children: Not on file  . Years of education: Not on file  . Highest education level: Not on file  Occupational History  . Not on file  Tobacco Use  . Smoking status: Former Research scientist (life sciences)  . Smokeless tobacco: Never Used  Substance and Sexual Activity  . Alcohol use: Not Currently  . Drug use: Not on file  . Sexual activity: Not on file  Other Topics Concern  . Not on file  Social History Narrative  . Not on file   Social Determinants of Health   Financial Resource Strain: Not on file  Food Insecurity: Not on file  Transportation Needs: Not on file  Physical Activity: Not on file  Stress: Not on file  Social Connections: Not on file  Intimate Partner Violence: Not on file    Family History  Problem Relation Age of Onset  . Hypertension Mother   . Hypertension Father   . Lymphoma Sister      Current Outpatient Medications:  .  acetaminophen (TYLENOL) 500 MG tablet, Take 1,000 mg by mouth at bedtime., Disp: , Rfl:  .  aspirin EC 81 MG tablet, Take 81 mg by mouth daily., Disp: , Rfl:  .  atorvastatin (LIPITOR) 40 MG tablet, Take 40 mg by mouth daily., Disp: , Rfl:  .  buPROPion  (WELLBUTRIN XL) 150 MG 24 hr tablet, Take 150 mg by mouth daily., Disp: , Rfl:  .  carbidopa-levodopa (SINEMET IR) 25-100 MG tablet, Take 1 tablet by mouth 4 (four) times daily., Disp: 30 tablet, Rfl: 0 .  folic acid (FOLVITE) 1 MG tablet, Take 3 mg by mouth daily., Disp: , Rfl:  .  memantine (NAMENDA) 5 MG tablet, Take 1 tablet by mouth 2 (two) times daily., Disp: , Rfl:  .  metFORMIN (GLUCOPHAGE) 1000 MG tablet, Take 1,000 mg by mouth 2 (two) times daily with a meal., Disp: , Rfl:  .  Methotrexate 25 MG/ML SOSY, Inject 0.8 mLs into the skin every Tuesday. , Disp: , Rfl:  .  PARoxetine (PAXIL) 40 MG tablet, Take 1 tablet (40 mg total) by mouth daily., Disp: 30 tablet, Rfl: 0 .  QUEtiapine (SEROQUEL) 25 MG tablet, Take 175 mg by mouth at bedtime. , Disp: , Rfl:  .  rivastigmine (EXELON) 1.5 MG capsule, Take 1 capsule (1.5 mg total) by mouth 2 (two)  times daily., Disp: 60 capsule, Rfl: 0 .  tamsulosin (FLOMAX) 0.4 MG CAPS capsule, Take 0.4 mg by mouth daily. , Disp: , Rfl:   Physical exam: There were no vitals filed for this visit. Physical Exam   Patient was not examined. Patient was not present. Spoke to patient's wife  CMP Latest Ref Rng & Units 01/03/2020  Glucose 70 - 99 mg/dL 101(H)  BUN 8 - 23 mg/dL 19  Creatinine 0.61 - 1.24 mg/dL 0.71  Sodium 135 - 145 mmol/L 140  Potassium 3.5 - 5.1 mmol/L 4.0  Chloride 98 - 111 mmol/L 103  CO2 22 - 32 mmol/L 30  Calcium 8.9 - 10.3 mg/dL 8.8(L)  Total Protein 6.5 - 8.1 g/dL -  Total Bilirubin 0.3 - 1.2 mg/dL -  Alkaline Phos 38 - 126 U/L -  AST 15 - 41 U/L -  ALT 0 - 44 U/L -   CBC Latest Ref Rng & Units 01/03/2020  WBC 4.0 - 10.5 K/uL 6.3  Hemoglobin 13.0 - 17.0 g/dL 11.1(L)  Hematocrit 39.0 - 52.0 % 33.6(L)  Platelets 150 - 400 K/uL 186    No images are attached to the encounter.  CT Chest Wo Contrast  Result Date: 05/11/2020 CLINICAL DATA:  Follow-up pulmonary nodule EXAM: CT CHEST WITHOUT CONTRAST TECHNIQUE: Multidetector CT  imaging of the chest was performed following the standard protocol without IV contrast. COMPARISON:  CT chest dated 12/30/2019 FINDINGS: Cardiovascular: Heart is normal in size.  No pericardial effusion. No evidence of thoracic aortic aneurysm. Atherosclerotic calcifications of the aortic arch. Mild three-vessel coronary atherosclerosis. Mediastinum/Nodes: No suspicious mediastinal lymphadenopathy. Visualized thyroid is unremarkable. Lungs/Pleura: Mild biapical pleural-parenchymal scarring. Mild subpleural ground-glass opacity in the right upper lobe (series 3/image 36), favoring post infectious/inflammatory scarring, unchanged. 5 mm nodules in the bilateral upper lobes (series 3/images 31 and 46), unchanged, benign. Additional 3 mm nodule in the left lung apex (series 3/image 25), benign. 4 mm subpleural nodule in the right lung base (series 3/image 183), unchanged, benign. No new/suspicious pulmonary nodules. No focal consolidation. No pleural effusion or pneumothorax. Upper Abdomen: Visualized upper abdomen is notable for a left upper pole renal sinus cyst. Musculoskeletal: Mild thoracic dextroscoliosis. IMPRESSION: Scattered small bilateral pulmonary nodules measuring up to 5 mm, unchanged, likely benign. Five month stability has been demonstrated. If high risk, a single follow-up CT chest can be considered in 12 months, as clinically warranted. This recommendation follows the consensus statement: Guidelines for Management of Small Pulmonary Nodules Detected on CT Images: From the Fleischner Society 2017; Radiology 2017; 284:228-243. Aortic Atherosclerosis (ICD10-I70.0). Electronically Signed   By: Julian Hy M.D.   On: 05/11/2020 11:03     Assessment and plan- Patient is a 75 y.o. male who presents to pulmonary nodule clinic for follow-up of incidental lung nodules.  A telephone visit was conducted with patients wife to review most recent CT scan results.    CT chest without contrast from 05/11/20  showed scattered small bilateral pulmonary nodules measuring up to 5 mm, unchanged, likely benign. Five month stability has been demonstrated. If high risk, a single follow-up CT chest can be considered in 12 months, as clinically warranted.  After speaking to patient's wife, no additional follow-up is warranted in our clinic.  He quit smoking greater than 25 years ago.  I would consider him a low risk for the development of lung cancer.  He has no respiratory concerns at this time.  Will defer to PCP.    Calculating malignancy probability  of a pulmonary nodule: Risk factors include: 1.  Age. 2.  Cancer history. 3.  Diameter of pulmonary nodule and mm 4.  Location 5.  Smoking history 6.  Spiculation present    During our visit, we discussed pulmonary nodules are a common incidental finding and are often how lung cancer is discovered.  Lung cancer survival is directly related to the stage at diagnosis.  We discussed that nodules can vary in presentation from solitary pulmonary nodules to masses, 2 groundglass opacities and multiple nodules.  Pulmonary nodules in the majority of cases are benign but the probability of these becoming malignant cannot be undermined.  Early identification of malignant nodules could lead to early diagnosis and increased survival.   We discussed the probability of pulmonary nodules becoming malignant increase with age, pack years of tobacco use, size/characteristics of the nodule and location; with upper lobe involvement being most worrisome.   We discussed the goal of our clinic is to thoroughly evaluate each nodule, developed a comprehensive, individualized plan of care utilizing the most advanced technology and significantly reduce the time from detection to treatment.  A dedicated pulmonary nodule clinic has proven to indeed expedite the detection and treatment of lung cancer.   Patient education in fact sheet provided along with most recent CT scans.  Plan: We  discussed recent CT scan. We discussed his social and environmental factors. We discussed his smoking history. No additional follow-up is needed. Referral sent to neurology and urology so he can establish care.  Disposition: No follow-up needed.  Follow-up with PCP as needed. Referral sent for urology for prostate cancer follow-up. Referral sent for neurology to establish care for Parkinson's and dementia.   Visit Diagnosis 1. Pulmonary nodules   2. History of prostate cancer   3. Dementia with behavioral disturbance, unspecified dementia type ([place])   4. Parkinson disease ([place])     Patient expressed understanding and was in agreement with this plan. He also understands that He can call clinic at any time with any questions, concerns, or complaints.   Greater than 50% was spent in counseling and coordination of care with this patient including but not limited to discussion of the relevant topics above (See A&P) including, but not limited to diagnosis and management of acute and chronic medical conditions.   Thank you for allowing me to participate in the care of this very pleasant patient.    Jacquelin Hawking, NP [place] [facility] Cell - [phone] Pager- [phone] 05/16/2020 12:00 PM

## 2020-05-14 NOTE — Telephone Encounter (Signed)
Patients wife called to reschedule his 10 am appt for CT results.  He is at [facility] and does feel comfortable bringing him out in this weather.  Routing to [employer]. For rescheduling.

## 2020-05-14 NOTE — Telephone Encounter (Signed)
Spoke with pt's wife and changed appt to virtual visit today.

## 2020-05-24 ENCOUNTER — Ambulatory Visit (INDEPENDENT_AMBULATORY_CARE_PROVIDER_SITE_OTHER): Payer: Medicare HMO | Admitting: Urology

## 2020-05-24 ENCOUNTER — Other Ambulatory Visit: Payer: Self-pay

## 2020-05-24 ENCOUNTER — Encounter: Payer: Self-pay | Admitting: Urology

## 2020-05-24 VITALS — Ht 71.0 in | Wt 150.0 lb

## 2020-05-24 DIAGNOSIS — C61 Malignant neoplasm of prostate: Secondary | ICD-10-CM | POA: Diagnosis not present

## 2020-05-24 DIAGNOSIS — N390 Urinary tract infection, site not specified: Secondary | ICD-10-CM

## 2020-05-24 DIAGNOSIS — R350 Frequency of micturition: Secondary | ICD-10-CM | POA: Diagnosis not present

## 2020-05-24 LAB — URINALYSIS, COMPLETE
Bilirubin, UA: NEGATIVE
Glucose, UA: NEGATIVE
Ketones, UA: NEGATIVE
Nitrite, UA: POSITIVE — AB
RBC, UA: NEGATIVE
Specific Gravity, UA: 1.02 (ref 1.005–1.030)
Urobilinogen, Ur: 1 mg/dL (ref 0.2–1.0)
pH, UA: 7.5 (ref 5.0–7.5)

## 2020-05-24 LAB — MICROSCOPIC EXAMINATION: WBC, UA: >30 /hpf — AB (ref 0–5)

## 2020-05-24 LAB — BLADDER SCAN AMB NON-IMAGING: Scan Result: 22

## 2020-05-24 NOTE — Progress Notes (Signed)
In and Out Catheterization  Patient is present today for a I & O catheterization due to poor mobility and urinary incontinence. Patient was cleaned and prepped in a sterile fashion with betadine.  A 14FR cath was inserted no complications were noted, 27ml of urine return was noted, urine was straw yellow in color. A clean urine sample was collected for urinalysis and urine culture. Bladder was drained  And catheter was removed with out difficulty.    Preformed by: Gordy Clement, CMA   Follow up/ Additional notes: RTC as scheduled.

## 2020-05-24 NOTE — Progress Notes (Signed)
05/24/20 11:00 AM   [name] September 05, 1945 [id number]  CC: Prostate cancer, recurrent UTIs, urinary symptoms  HPI: I saw Dr. Wynetta Emery and his wife in urology clinic for the above issues.  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  He is currently residing in a nursing home.  He was previously followed by urology at [facility], and I reviewed those notes extensively.  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 Lottie Rater with Gleason score 3+4=7 prostate cancer in 3/12 cores with 20% max core involvement, and prostate measured 46 g.  They had considered CyberKnife, as well as external radiation, and he even had fiducial markers placed in the prostate, however secondary to his declining health he ultimately deferred definitive management and opted for watchful waiting.  He has had multiple episodes of UTI and sepsis from urinary source, most recently in August 2021.  PSA was 19.6 in September 2020, and 30 in August 2021 at time of acute UTI with culture documented Proteus.  He also underwent staging imaging in January 2021 with bone scan and CT abdomen pelvis that showed no definite evidence of metastatic disease.  He has had at least a few months of worsening urinary symptoms including urgency and frequency day and night.  He denies any dysuria, fevers, or chills.  He may have had some gross hematuria as well in his depends but they are unsure.  The history is obtained primarily from his wife today, as well as extensive chart review.  PVR today is normal at 22 mL.  He was straight cathed for urinalysis which was grossly concerning for infection with greater than 30 WBCs, 3-10 RBCs, many bacteria, nitrite positive, 1+  leukocytes.   PMH: Past Medical History:  Diagnosis Date  . Cancer ([place])   . Dementia ([place])   . Diabetes mellitus without complication ([employer])   . Hypertension   . Parkinson disease ([place])   . Prostate cancer [facility])     Surgical History: Past Surgical History:  Procedure Laterality Date  . CLOSED REDUCTION CLAVICLE FRACTURE      Family History: Family History  Problem Relation Age of Onset  . Hypertension Mother   . Hypertension Father   . Lymphoma Sister     Social History:  reports that he has quit smoking. He has never used smokeless tobacco. He reports previous alcohol use. No history on file for drug use.  Physical Exam: Ht 5\' 11"  (1.803 m)   Wt 150 lb (68 kg)   BMI 20.92 kg/m    Constitutional: In wheelchair, extremely frail appearing Cardiovascular: No clubbing, cyanosis, or edema. Respiratory: Normal respiratory effort, no increased work of breathing. GI: Abdomen is soft, nontender, nondistended, no abdominal masses  Laboratory Data: Reviewed, see HPI  Pertinent Imaging: Reviewed, see HPI  Assessment & Plan:   To summarize he is a very comorbid and frail-appearing 75 year old male with Parkinson's who resides in an assisted living facility currently.  He has known intermediate risk prostate cancer that was diagnosed in November 2020 with 3+4 =7 prostate cancer with 3/12 cores involved, max core involvement of 20%, and PSA of 12 at time of diagnosis.  This was diagnosed at [facility] and they opted for watchful waiting, which  I think is very reasonable with his significant comorbidities and frailty.  He also has had recurrent UTIs that have clouded his PSA monitoring moving forward, and we discussed the likely false elevation of PSA in the setting of UTI.  On straight cath urinalysis today he appears to again have a UTI, urine was sent for culture, and he was started on 3 weeks of Bactrim DS twice daily.  We discussed that we can continue to follow the PSA every 6 to 12  months for a watchful waiting type approach, but I would not recommend checking a PSA today in the setting of active infection.  Regarding his urinary symptoms, he may notice a significant improvement after antibiotics with his urgency and frequency, however if he has persistent OAB symptoms, may want to try Myrbetriq in the future.  We reviewed at length that my goals for him would be to manage his urinary symptoms, decreased risk of infections, and monitor the PSA for watchful waiting approach to his intermediate risk disease.  I agree that with his frailty, definitive management with radiation, surgery, or even hormones would not be a good option.  I also recommended cranberry tablets twice daily to help prevent UTIs.  -Bactrim DS twice daily x3 weeks for acute UTI, follow-up culture results -RTC 6 weeks for symptom check-> consider Myrbetriq if persistent OAB symptoms and urinalysis benign at that time, discuss timing of PSA monitoring for watchful waiting of intermediate risk prostate cancer  I spent 70 total minutes on the day of the encounter including pre-visit review of the medical record, face-to-face time with the patient, and post visit ordering of labs/imaging/tests.  Nickolas Madrid, MD 05/24/2020  [facility] [address] [place], [place] [address] [phone]

## 2020-05-30 NOTE — Telephone Encounter (Signed)
Bactrim would typically cover staph, but sensitivities are not back yet  Seth Madrid, MD 05/30/2020

## 2020-05-31 ENCOUNTER — Telehealth: Payer: Self-pay

## 2020-05-31 DIAGNOSIS — N390 Urinary tract infection, site not specified: Secondary | ICD-10-CM

## 2020-05-31 LAB — CULTURE, URINE COMPREHENSIVE

## 2020-05-31 MED ORDER — NITROFURANTOIN MONOHYD MACRO 100 MG PO CAPS: 100.0000 mg | ORAL_CAPSULE | Freq: Two times a day (BID) | ORAL | 0 refills | Status: DC

## 2020-05-31 MED ORDER — NITROFURANTOIN MONOHYD MACRO 100 MG PO CAPS: 100.0000 mg | ORAL_CAPSULE | Freq: Two times a day (BID) | ORAL | 0 refills | Status: AC

## 2020-05-31 NOTE — Telephone Encounter (Signed)
Agree, ok to place referral to palliative care and they will help facilitate  Nickolas Madrid, MD 05/31/2020

## 2020-05-31 NOTE — Telephone Encounter (Signed)
-----   Message from [place]ley Co, MD sent at 05/31/2020  7:32 AM EST ----- Culture was resistant to bactrim, please change to nitrofurantoin 100mg  BID x 14 days,thanks  Nickolas Madrid, MD 05/31/2020

## 2020-05-31 NOTE — Telephone Encounter (Signed)
Called pt's wife informed her of the information below. Wife does not believe they will need a referral.

## 2020-05-31 NOTE — Telephone Encounter (Signed)
[facility] made them aware they request RX be faxed to them, RX faxed to [phone].   Called pt's wife informed her of the information below. Wife gave verbal understanding. Wife also requests that I ask physician his thoughts on bringing in Hospice to help add an additional level of care for the patient. Please advise.

## 2020-07-10 ENCOUNTER — Telehealth: Payer: Self-pay | Admitting: Urology

## 2020-07-12 ENCOUNTER — Ambulatory Visit: Payer: Self-pay | Admitting: Urology

## 2020-10-16 ENCOUNTER — Other Ambulatory Visit: Payer: Self-pay | Admitting: Neurology

## 2020-10-16 DIAGNOSIS — F0280 Dementia in other diseases classified elsewhere without behavioral disturbance: Secondary | ICD-10-CM

## 2020-10-26 ENCOUNTER — Ambulatory Visit (HOSPITAL_COMMUNITY)
Admission: RE | Admit: 2020-10-26 | Discharge: 2020-10-26 | Disposition: A | Discharge status: Home / Self Care | Source: Ambulatory Visit | Attending: Neurology | Admitting: Neurology

## 2020-10-26 ENCOUNTER — Other Ambulatory Visit: Payer: Self-pay

## 2020-10-26 DIAGNOSIS — G3183 Dementia with Lewy bodies: Secondary | ICD-10-CM | POA: Diagnosis present

## 2020-10-26 DIAGNOSIS — F0280 Dementia in other diseases classified elsewhere without behavioral disturbance: Secondary | ICD-10-CM

## 2020-11-28 IMAGING — CR DG CHEST 2V
1 series · 3 of 3 positions shown · non-contrast
Comparison: 11/06/2019

CLINICAL DATA: Weakness.  Productive cough and fever.

EXAM:
CHEST - 2 VIEW

[Series 1: dg chest 2 view · 0.14mm/px · 3 of 3 slices shown]
[im 1/3]
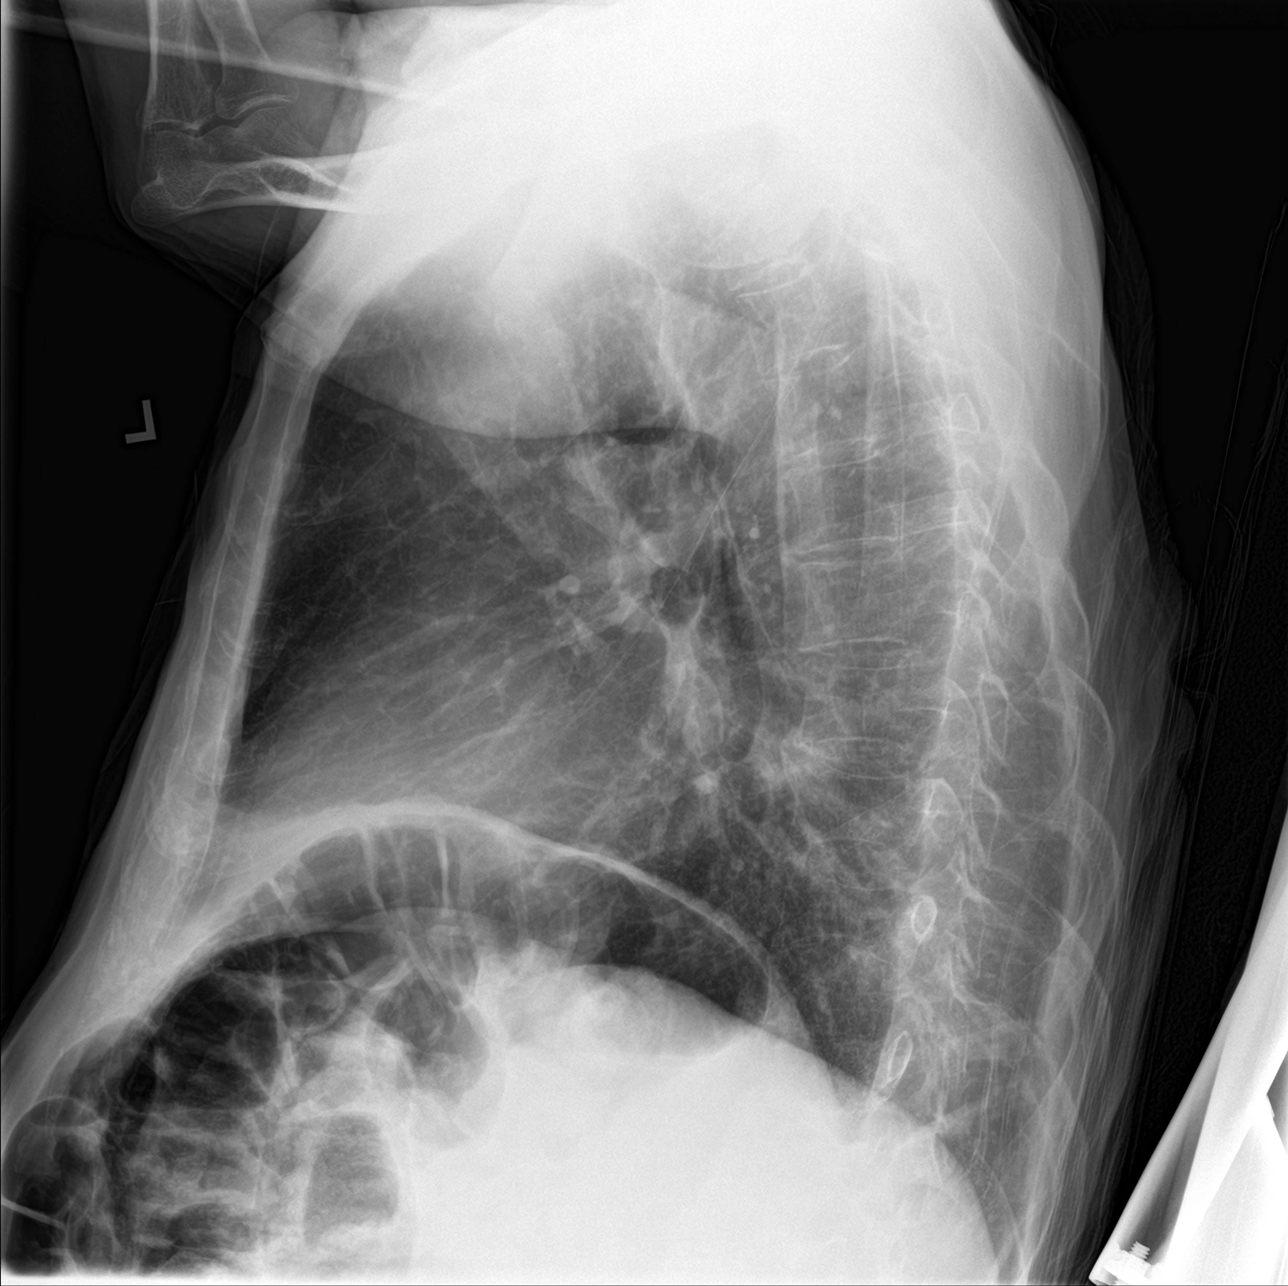
[im 2/3]
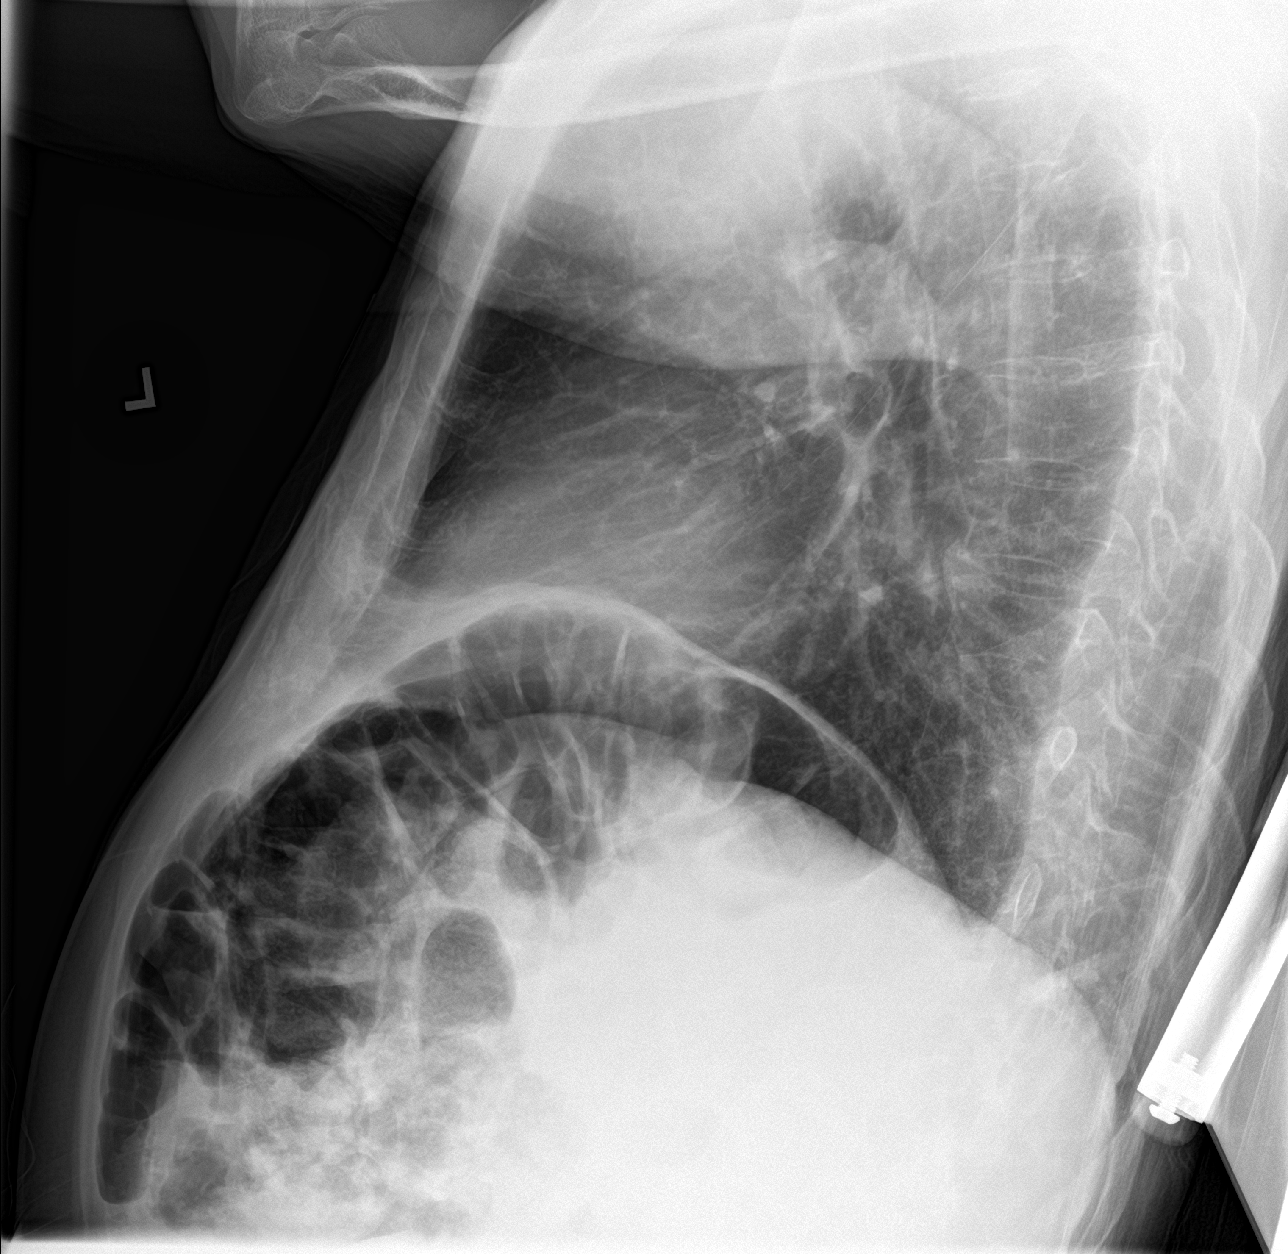
[im 3/3]
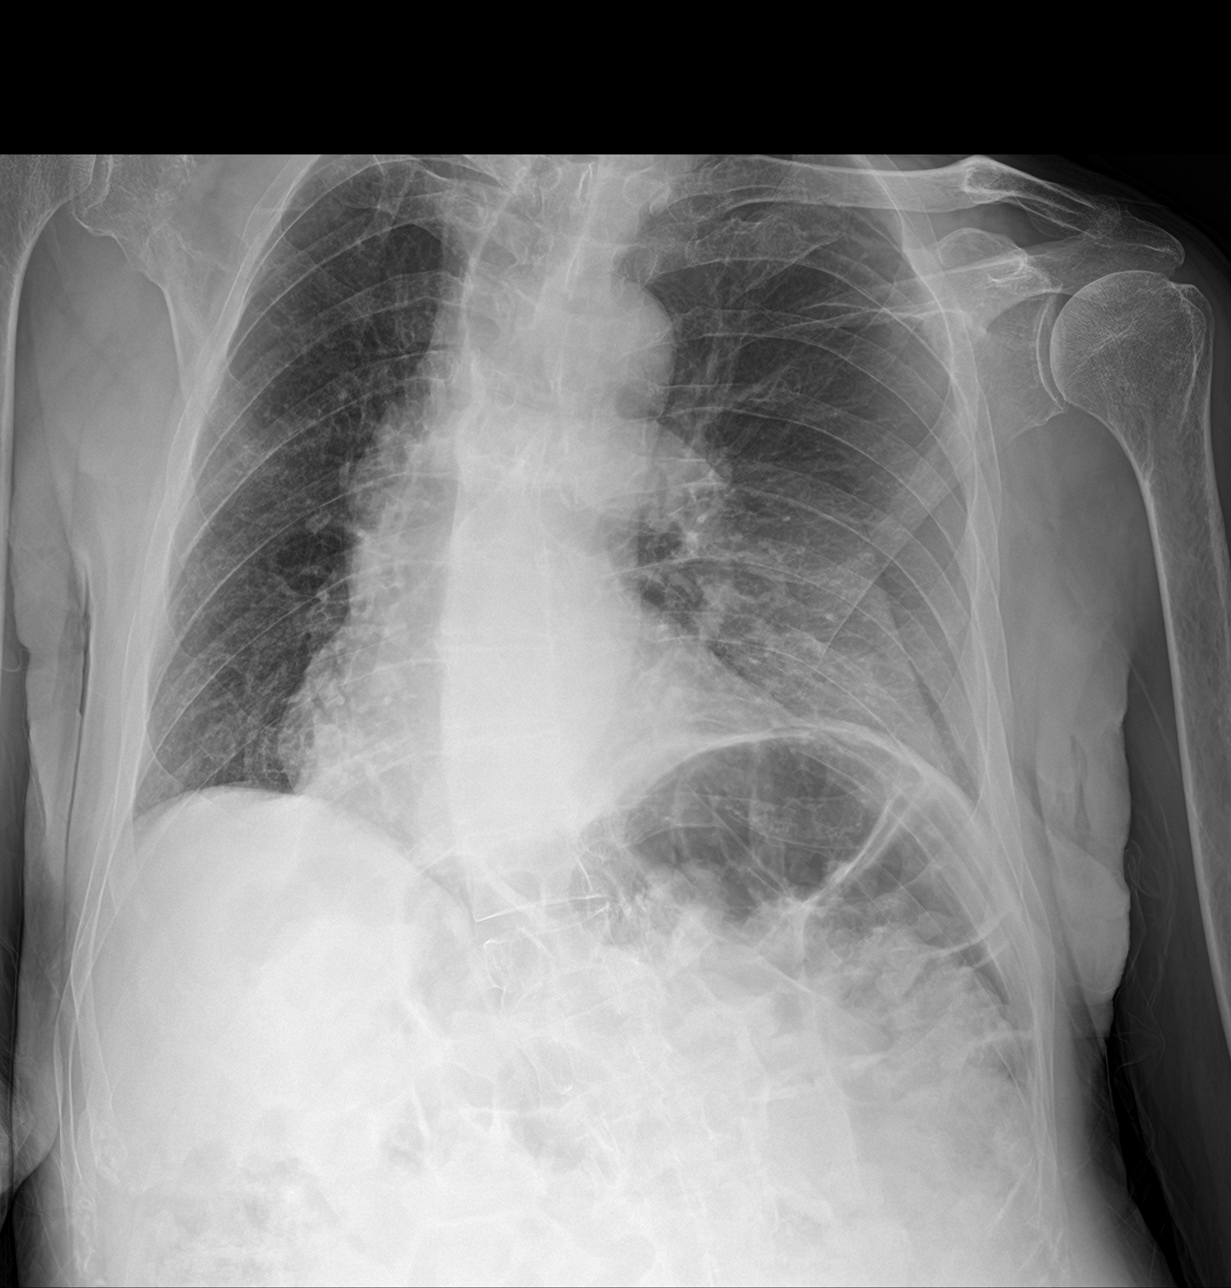

[3 of 3 positions shown; findings below may reference images not displayed]

FINDINGS: Heart size and vascularity normal.  Negative for heart failure

Elevated left hemidiaphragm with distended bowel below the diaphragm
similar to the prior study. Mild left lower lobe airspace disease
unchanged from the prior study. Probable atelectasis. No definite
pneumonia or effusion on the lateral view.
IMPRESSION: Elevated left hemidiaphragm with mild left lower lobe atelectasis
unchanged from the prior study.

## 2020-11-29 IMAGING — CT CT CHEST W/O CM
2 of 3 series · 15 of 36 positions shown, 18 images · non-contrast
Comparison: Chest radiographs 04/27/2019 and 06/03/2019

CLINICAL DATA: Pneumonia, effusion, or abscess suspected.

EXAM:
CT CHEST WITHOUT CONTRAST
TECHNIQUE: Multidetector CT imaging of the chest was performed following the
standard protocol without IV contrast.

[Series 2: thorax · axial · 0.65mm/px · z∈[-796,-524]mm · 12 of 160 slices shown, 15 images]
[im 12/160  mediastinal]
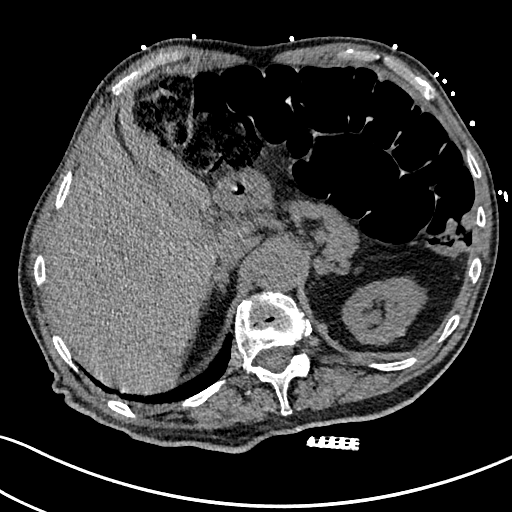
[im 12/160  lung]
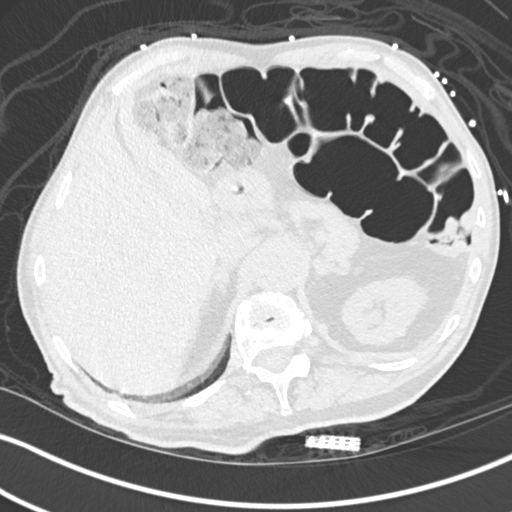
[im 24/160  lung]
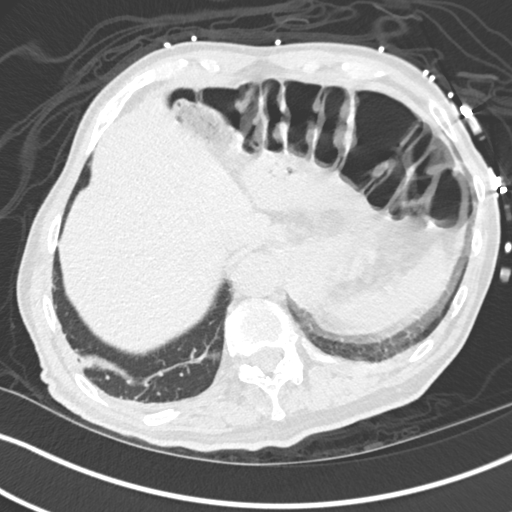
[im 36/160  lung]
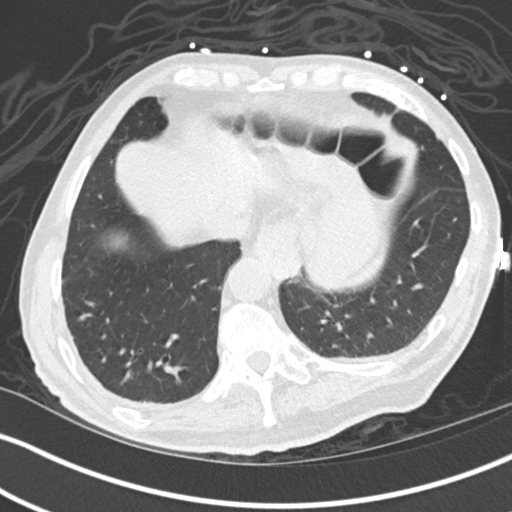
[im 48/160  lung]
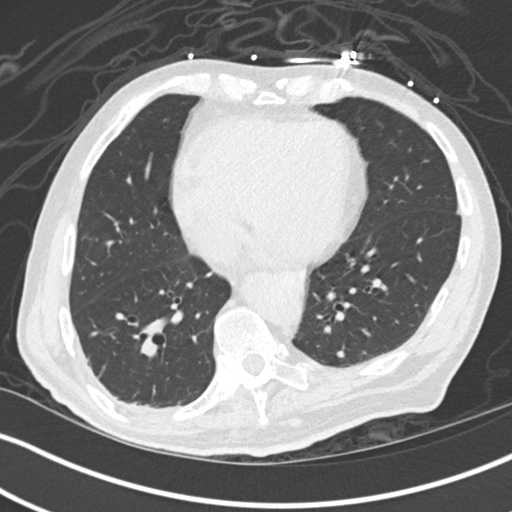
[im 59/160  mediastinal]
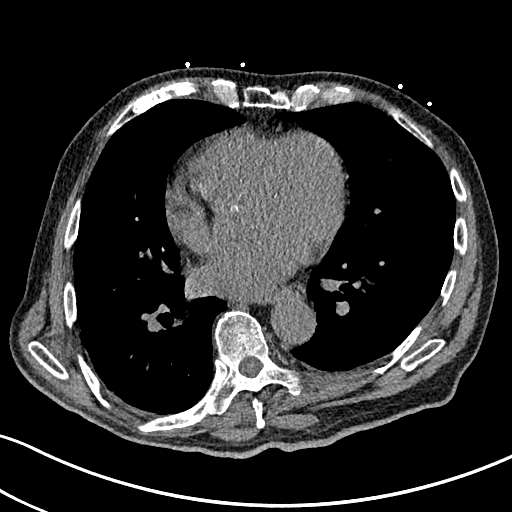
[im 59/160  lung]
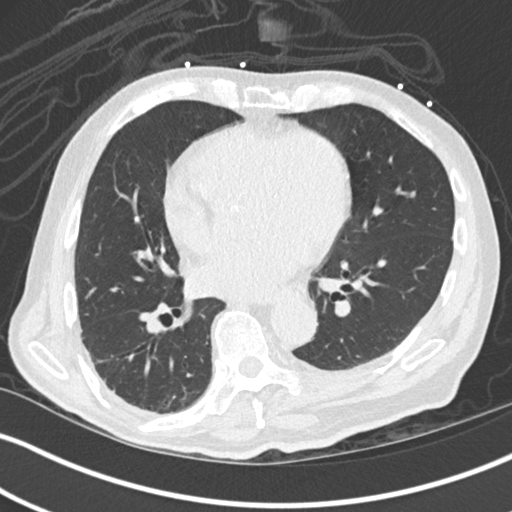
[im 71/160  lung]
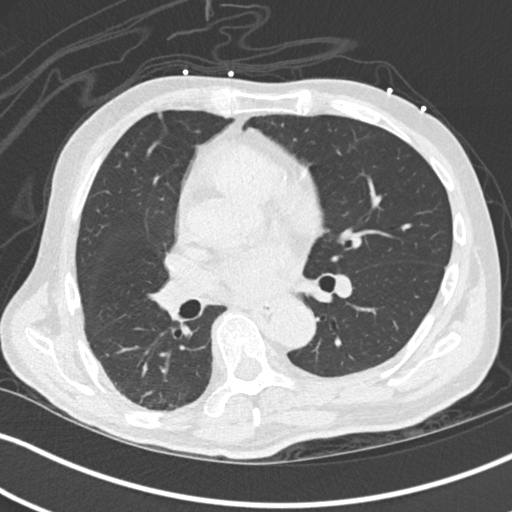
[im 89/160  lung]
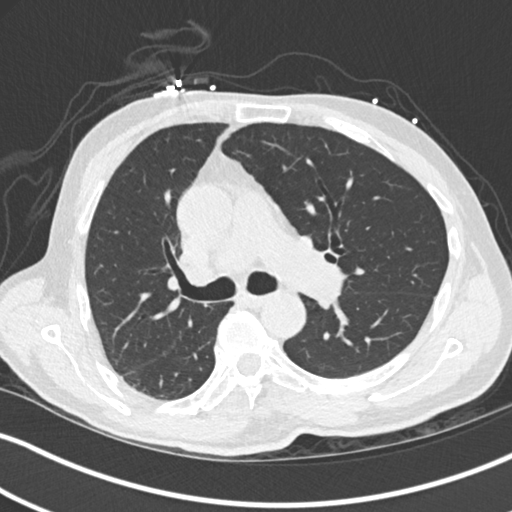
[im 101/160  lung]
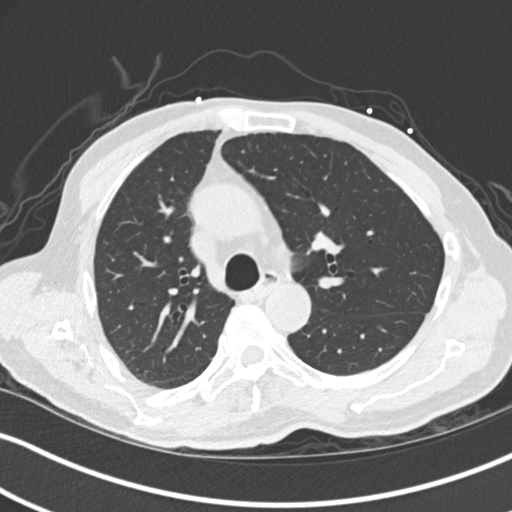
[im 112/160  mediastinal]
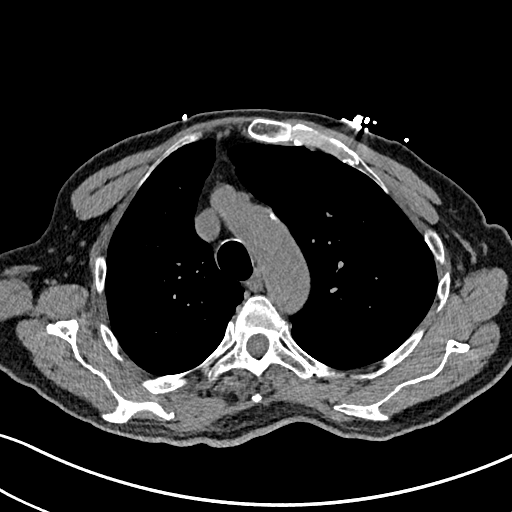
[im 112/160  lung]
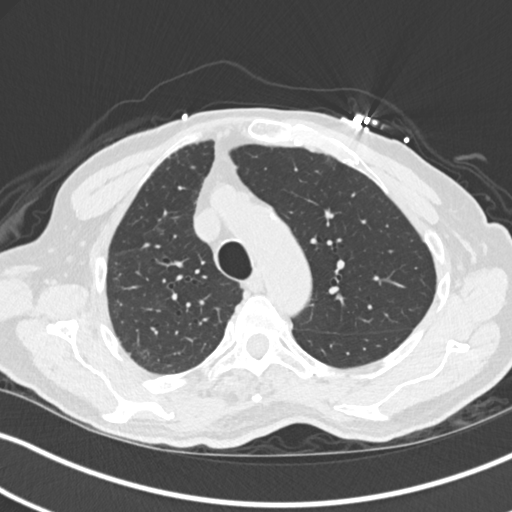
[im 124/160  lung]
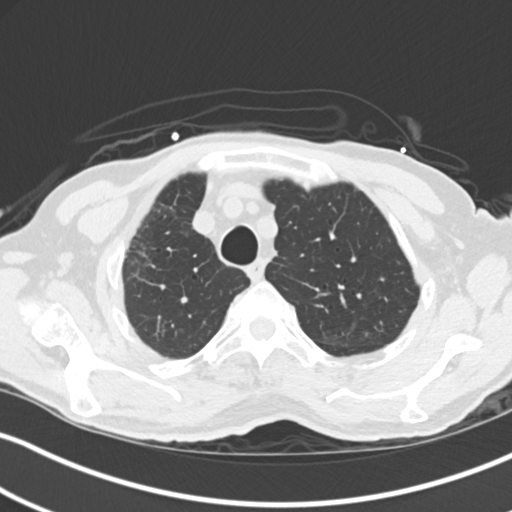
[im 136/160  lung]
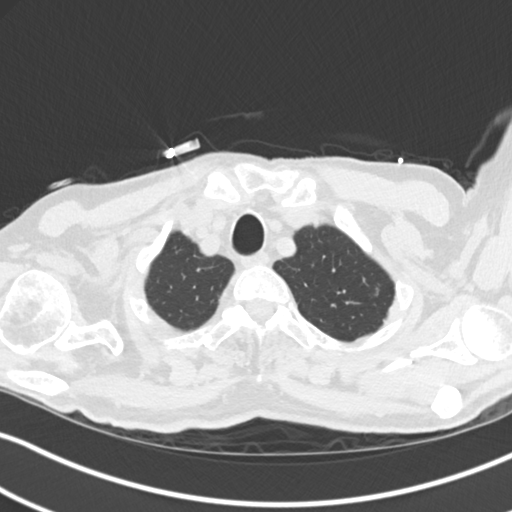
[im 148/160  lung]
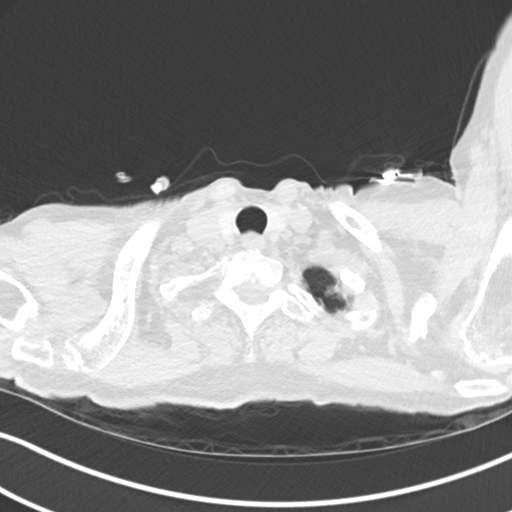

[Series 5: coronal · coronal · 0.66mm/px · 3 of 147 slices shown]
[im 30/147  lung]
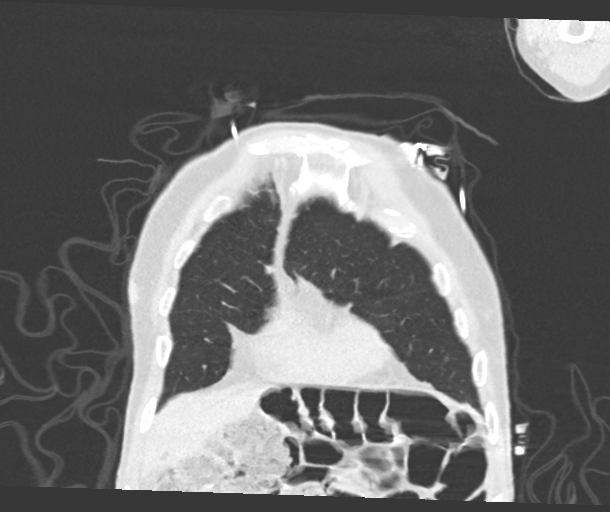
[im 59/147  lung]
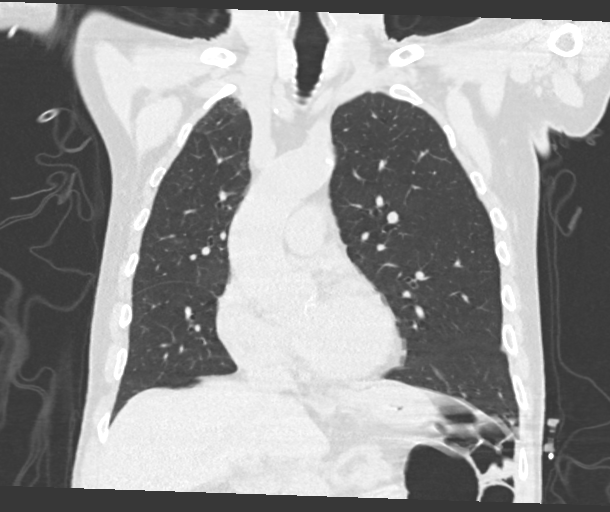
[im 88/147  lung]
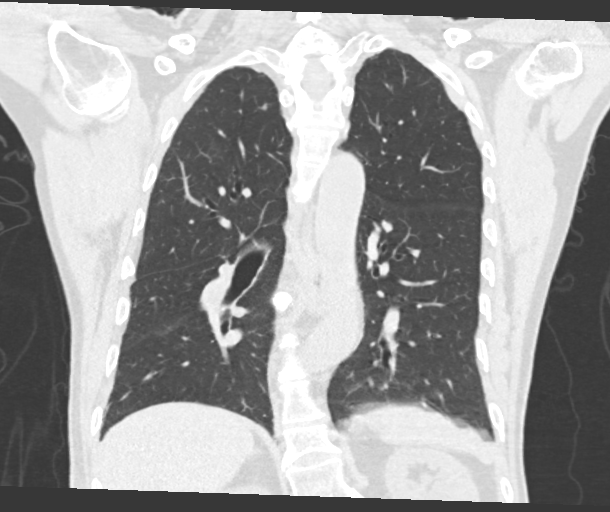

[15 of 36 positions shown; findings below may reference images not displayed]

FINDINGS: Cardiovascular: No significant vascular findings. Normal heart size.
No pericardial effusion. Coronary artery and aortic atherosclerosis.

Mediastinum/Nodes: No enlarged mediastinal or axillary lymph nodes.
Thyroid gland and trachea demonstrate no significant findings. Small
hiatal hernia.

Lungs/Pleura: No consolidation. No pleural effusion. No
pneumothorax. Mild scarring/fibrotic change in the right upper lobe
and linear atelectasis.There is a 5 mm pulmonary nodule in the right
upper lobe (series 3, image 46) there is an approximately 6 mm
pulmonary nodule in the left upper lobe (series 3, image 30) in the
right upper lobe. Irregular 4 mm nodule in the left upper lobe
(image 21).

Upper Abdomen: Partially imaged left renal cyst.

Musculoskeletal: Remote compression fracture in the lower thoracic
spine. Multilevel degenerative change of the spine. Dextrocurvature.
IMPRESSION: 1. Linear atelectasis in the right lower lobe. Otherwise, no acute
cardiopulmonary abnormality.
2. Multiple pulmonary nodules, measuring up to 6 mm. Non-contrast
chest CT at 3-6 months is recommended. If the nodules are stable at
time of repeat CT, then future CT at 18-24 months (from today's
scan) is considered optional for low-risk patients, but is
recommended for high-risk patients. This recommendation follows the
consensus statement: Guidelines for Management of Incidental
Pulmonary Nodules Detected on CT Images: From the [HOSPITAL]

## 2021-04-11 IMAGING — CT CT CHEST W/O CM
2 of 4 series · 15 of 36 positions shown, 18 images · non-contrast
Comparison: CT chest dated 12/30/2019

CLINICAL DATA: Follow-up pulmonary nodule

EXAM:
CT CHEST WITHOUT CONTRAST
TECHNIQUE: Multidetector CT imaging of the chest was performed following the
standard protocol without IV contrast.

[Series 2: chest 2.00 · axial · 0.63mm/px · z∈[-1247,-935]mm · 12 of 186 slices shown, 15 images]
[im 15/186  mediastinal]
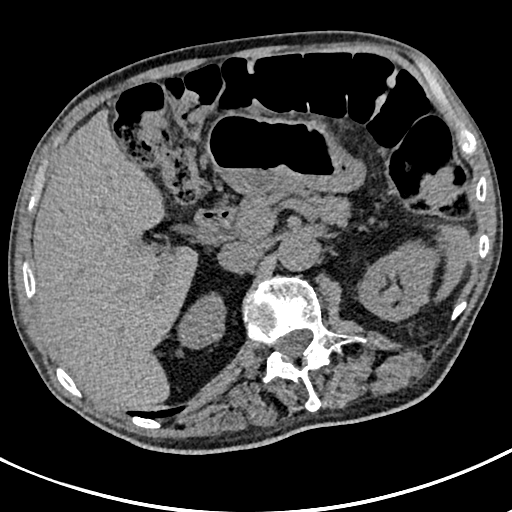
[im 15/186  lung]
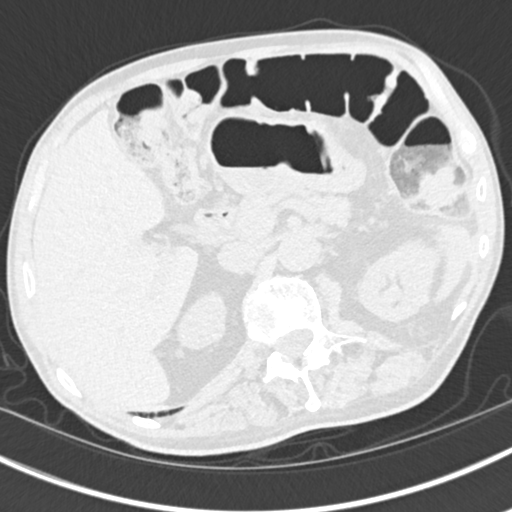
[im 29/186  lung]
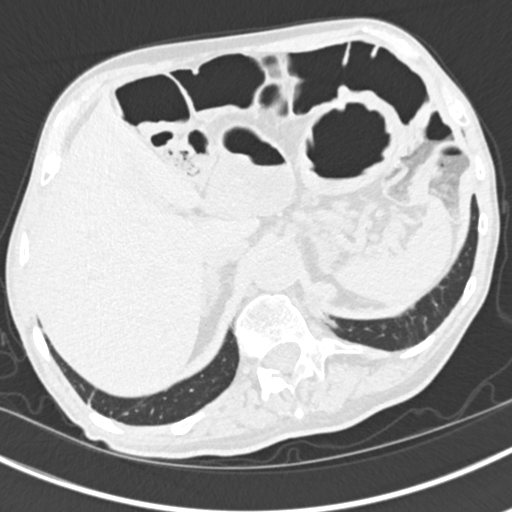
[im 43/186  lung]
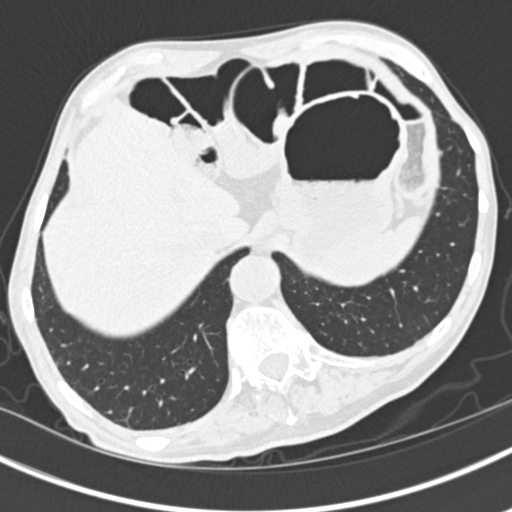
[im 57/186  lung]
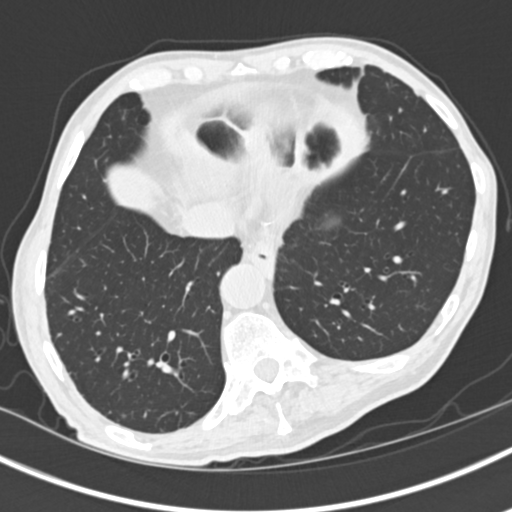
[im 72/186  mediastinal]
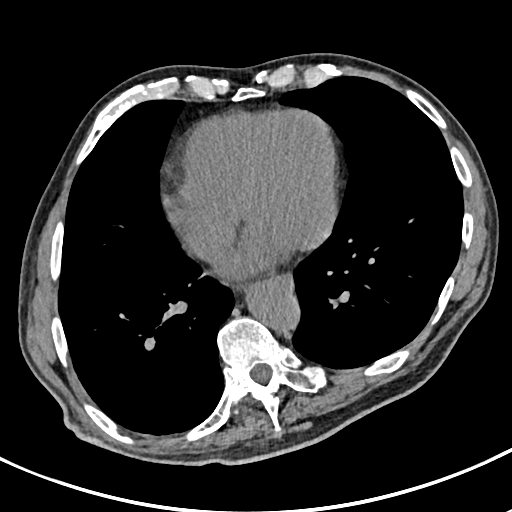
[im 72/186  lung]
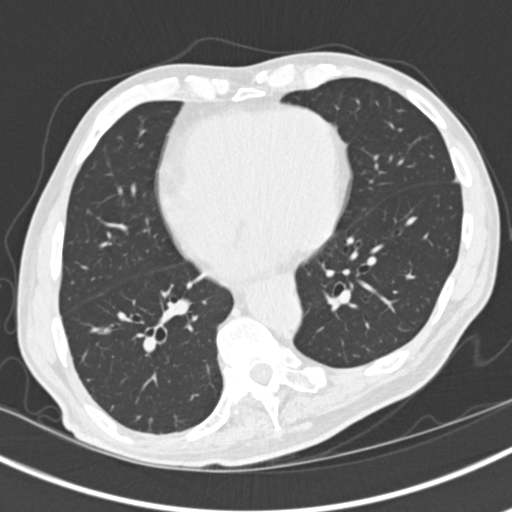
[im 86/186  lung]
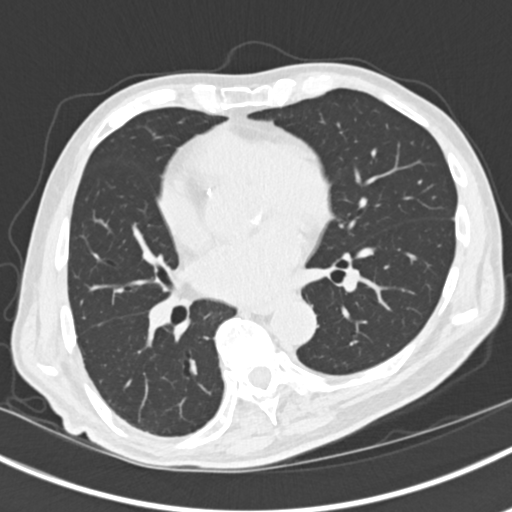
[im 100/186  lung]
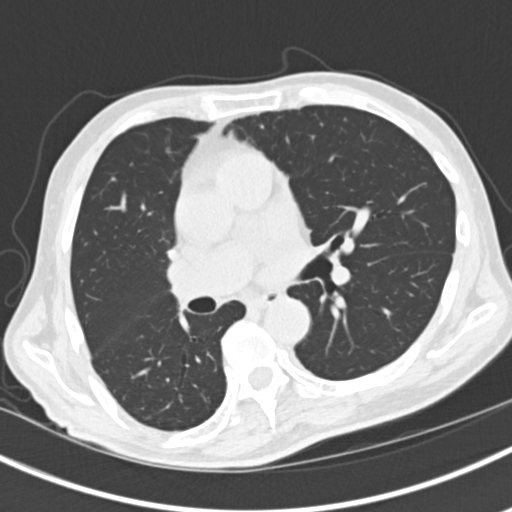
[im 114/186  lung]
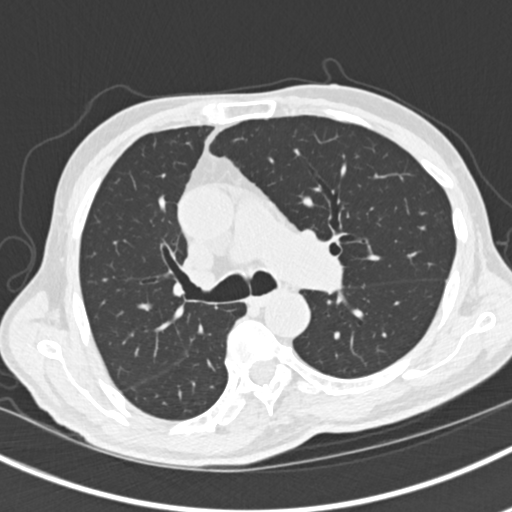
[im 129/186  mediastinal]
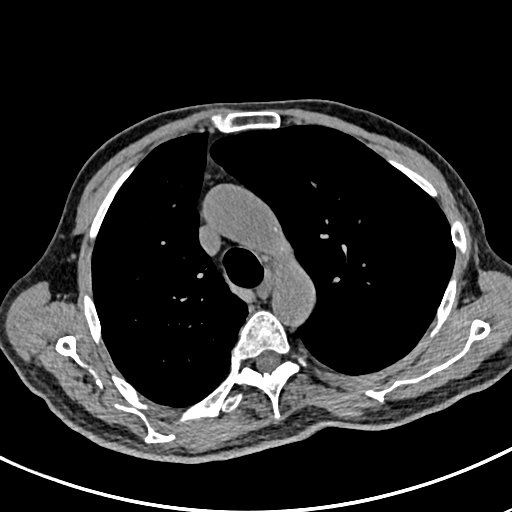
[im 129/186  lung]
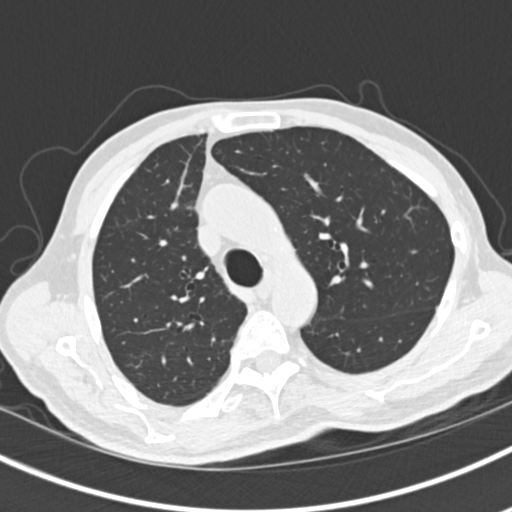
[im 143/186  lung]
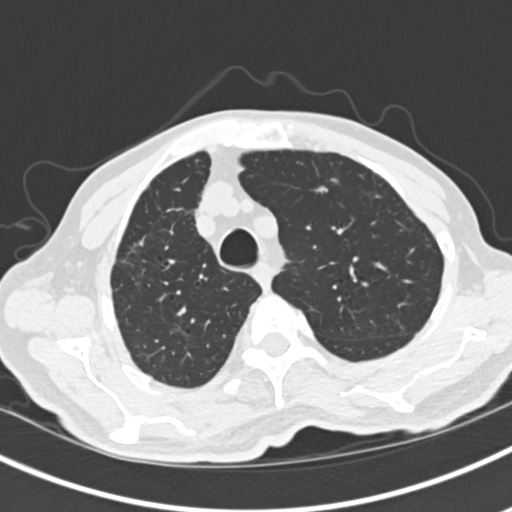
[im 157/186  lung]
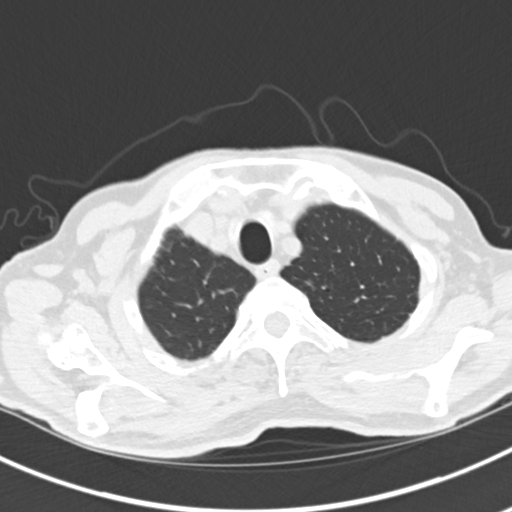
[im 171/186  lung]
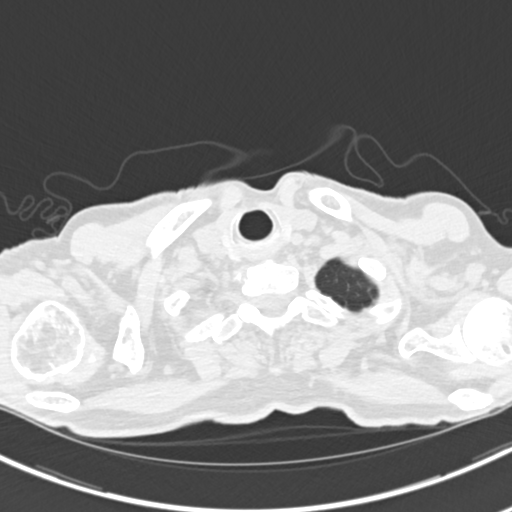

[Series 5: coronals chest 2.00 cor · coronal · 0.63mm/px · 3 of 140 slices shown]
[im 28/140  lung]
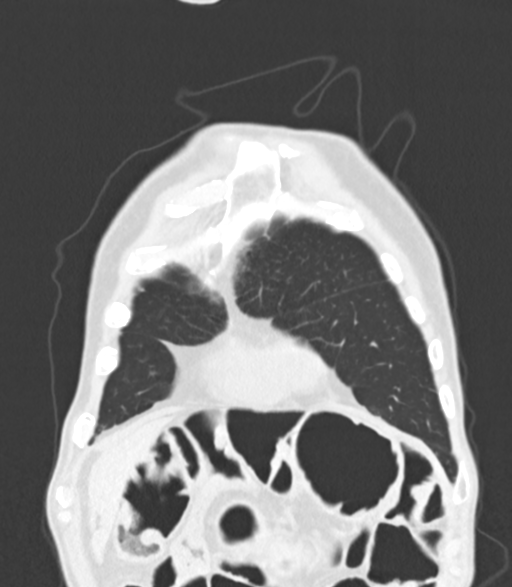
[im 56/140  lung]
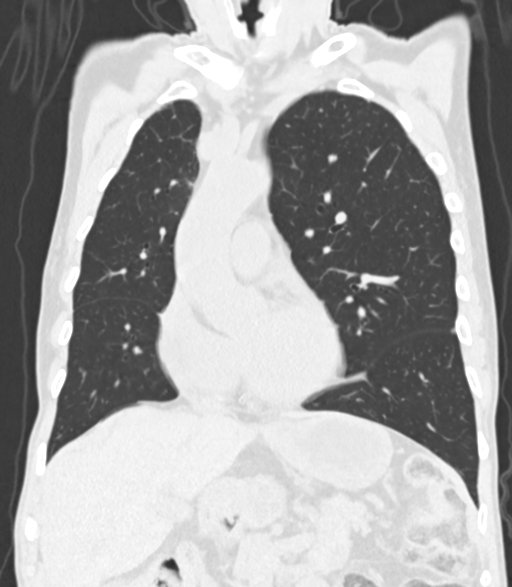
[im 84/140  lung]
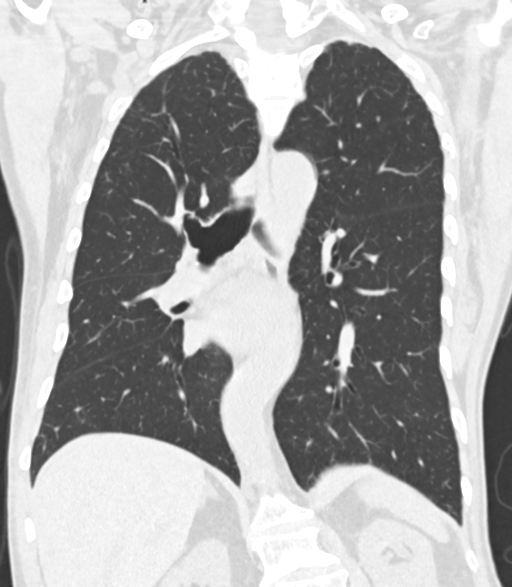

[15 of 36 positions shown; findings below may reference images not displayed]

FINDINGS: Cardiovascular: Heart is normal in size.  No pericardial effusion.

No evidence of thoracic aortic aneurysm. Atherosclerotic
calcifications of the aortic arch.

Mild three-vessel coronary atherosclerosis.

Mediastinum/Nodes: No suspicious mediastinal lymphadenopathy.

Visualized thyroid is unremarkable.

Lungs/Pleura: Mild biapical pleural-parenchymal scarring.

Mild subpleural ground-glass opacity in the right upper lobe (series
3/image 36), favoring post infectious/inflammatory scarring,
unchanged.

5 mm nodules in the bilateral upper lobes (series 3/images 31 and
46), unchanged, benign. Additional 3 mm nodule in the left lung apex
(series 3/image 25), benign. 4 mm subpleural nodule in the right
lung base (series 3/image 183), unchanged, benign. No new/suspicious
pulmonary nodules.

No focal consolidation.

No pleural effusion or pneumothorax.

Upper Abdomen: Visualized upper abdomen is notable for a left upper
pole renal sinus cyst.

Musculoskeletal: Mild thoracic dextroscoliosis.
IMPRESSION: Scattered small bilateral pulmonary nodules measuring up to 5 mm,
unchanged, likely benign. Five month stability has been
demonstrated. If high risk, a single follow-up CT chest can be
considered in 12 months, as clinically warranted. This
recommendation follows the consensus statement: Guidelines for
Management of Small Pulmonary Nodules Detected on CT Images: From

Aortic Atherosclerosis ([id number]-5TU.U).

## 2023-06-06 DEATH — deceased
